# Patient Record
Sex: Female | Born: 1995 | Race: Black or African American | Hispanic: No | Marital: Single | Smoking: Never smoker
Health system: Southern US, Community
[De-identification: ages and names within clinical notes are randomized; demographics above are authoritative.]

## PROBLEM LIST (undated history)

## (undated) DIAGNOSIS — Z789 Other specified health status: Secondary | ICD-10-CM

## (undated) DIAGNOSIS — R0600 Dyspnea, unspecified: Secondary | ICD-10-CM

## (undated) HISTORY — PX: NO PAST SURGERIES: SHX2092

---
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
## 2017-05-15 ENCOUNTER — Other Ambulatory Visit: Payer: Self-pay

## 2017-05-15 ENCOUNTER — Emergency Department (HOSPITAL_COMMUNITY)
Admission: EM | Admit: 2017-05-15 | Discharge: 2017-05-15 | Disposition: A | Discharge status: Home / Self Care | Payer: Medicaid Other | Attending: Emergency Medicine | Admitting: Emergency Medicine

## 2017-05-15 ENCOUNTER — Encounter (HOSPITAL_COMMUNITY): Payer: Self-pay

## 2017-05-15 DIAGNOSIS — Z202 Contact with and (suspected) exposure to infections with a predominantly sexual mode of transmission: Secondary | ICD-10-CM | POA: Insufficient documentation

## 2017-05-15 DIAGNOSIS — Z79899 Other long term (current) drug therapy: Secondary | ICD-10-CM | POA: Insufficient documentation

## 2017-05-15 LAB — WET PREP, GENITAL
Sperm: NONE SEEN
Trich, Wet Prep: NONE SEEN
Yeast Wet Prep HPF POC: NONE SEEN

## 2017-05-15 LAB — POC URINE PREG, ED: PREG TEST UR: NEGATIVE

## 2017-05-15 LAB — URINALYSIS, ROUTINE W REFLEX MICROSCOPIC
Bilirubin Urine: NEGATIVE
Glucose, UA: NEGATIVE mg/dL
Hgb urine dipstick: NEGATIVE
Ketones, ur: NEGATIVE mg/dL
LEUKOCYTES UA: NEGATIVE
NITRITE: NEGATIVE
Protein, ur: NEGATIVE mg/dL
SPECIFIC GRAVITY, URINE: 1.026 (ref 1.005–1.030)
pH: 5 (ref 5.0–8.0)

## 2017-05-15 MED ORDER — AZITHROMYCIN 250 MG PO TABS
1000.0000 mg | ORAL_TABLET | Freq: Once | ORAL | Status: AC
  Administered 2017-05-15: 1000 mg via ORAL
  Filled 2017-05-15: qty 4

## 2017-05-15 MED ORDER — CEFTRIAXONE SODIUM 250 MG IJ SOLR
250.0000 mg | Freq: Once | INTRAMUSCULAR | Status: AC
  Administered 2017-05-15: 250 mg via INTRAMUSCULAR
  Filled 2017-05-15: qty 250

## 2017-05-15 MED ORDER — LIDOCAINE HCL (PF) 1 % IJ SOLN
INTRAMUSCULAR | Status: AC
  Administered 2017-05-15: 0.9 mL
  Filled 2017-05-15: qty 5

## 2017-05-15 NOTE — ED Triage Notes (Signed)
Patient reports that she thinks she may have been exposed to STD. No complains, denies symptoms.

## 2017-05-15 NOTE — Discharge Instructions (Signed)
Please read attached information. If you experience any new or worsening signs or symptoms please return to the emergency room for evaluation. Please follow-up with your primary care provider or specialist as discussed.  °

## 2017-05-15 NOTE — ED Notes (Signed)
Pt verbalized understanding of d.c instructions, no further questions 

## 2017-05-15 NOTE — ED Provider Notes (Signed)
[facility] Note  CSN: [id number] Arrival date & time: 05/15/17  1251  History   Chief Complaint No chief complaint on file.  HPI Rebekah Lloyd is a 21 y.o. female.  HPI   21 year old female presents today with reported STD exposure. Patient reports that her significant other was cheating on her with another female. She notes that she overheard that this other female had went to the health department for an unknown cause. She is concerned that potentially she has an STD and that's when she went to the clinic. Patient reports she would like to be tested and treated for STDs here today. She denies any significant symptoms including vaginal discharge, bleeding, abdominal pain, dysuria, or any other significant signs or symptoms.   Past Medical History:  Diagnosis Date  . Medical history non-contributory     Patient Active Problem List   Diagnosis Date Noted  . NSVD (normal spontaneous vaginal delivery) 11/22/2016  . Normal labor 11/19/2016    Past Surgical History:  Procedure Laterality Date  . NO PAST SURGERIES      OB History    Gravida Para Term Preterm AB Living   1 1 1     1    SAB TAB Ectopic Multiple Live Births         0 1       Home Medications    Prior to Admission medications   Medication Sig Start Date End Date Taking? Authorizing Provider  docusate sodium (COLACE) 100 MG capsule Take 1 capsule (100 mg total) by mouth 2 (two) times daily. 11/22/16   Mumaw, Hiram ComberElizabeth Woodland, DO  ibuprofen (ADVIL,MOTRIN) 600 MG tablet Take 1 tablet (600 mg total) by mouth every 6 (six) hours. 11/22/16   Mumaw, Hiram ComberElizabeth Woodland, DO  Prenatal Vit-Fe Fumarate-FA (PRENATAL MULTIVITAMIN) TABS tablet Take 1 tablet by mouth daily at 12 noon.    [provider]    Family History No family history on file.  Social History Social History   Tobacco Use  . Smoking status: Never Smoker  . Smokeless tobacco: Never Used    Substance Use Topics  . Alcohol use: No  . Drug use: No     Allergies   Patient has no known allergies.   Review of Systems Review of Systems  All other systems reviewed and are negative.    Physical Exam Updated Vital Signs BP 125/83 (BP Location: Right Arm)   Pulse 77   Temp 98.9 F (37.2 C) (Oral)   Resp 18   SpO2 100%   Physical Exam  Constitutional: She is oriented to person, place, and time. She appears well-developed and well-nourished.  HENT:  Head: Normocephalic and atraumatic.  Eyes: Conjunctivae are normal. Pupils are equal, round, and reactive to light. Right eye exhibits no discharge. Left eye exhibits no discharge. No scleral icterus.  Neck: Normal range of motion. No JVD present. No tracheal deviation present.  Pulmonary/Chest: Effort normal. No stridor.  Neurological: She is alert and oriented to person, place, and time. Coordination normal.  Psychiatric: She has a normal mood and affect. Her behavior is normal. Judgment and thought content normal.  Nursing note and vitals reviewed.    ED Treatments / Results  Labs (all labs ordered are listed, but only abnormal results are displayed) Labs Reviewed  WET PREP, GENITAL - Abnormal; Notable for the following components:      Result Value   Clue Cells Wet Prep HPF POC PRESENT (*)  WBC, Wet Prep HPF POC MANY (*)    All other components within normal limits  URINALYSIS, ROUTINE W REFLEX MICROSCOPIC - Abnormal; Notable for the following components:   APPearance HAZY (*)    All other components within normal limits  HIV ANTIBODY (ROUTINE TESTING)  POC URINE PREG, ED  GC/CHLAMYDIA PROBE AMP ([place]) NOT AT [facility]    EKG  EKG Interpretation None       Radiology No results found.  Procedures Procedures (including critical care time)  Medications Ordered in ED Medications  cefTRIAXone (ROCEPHIN) injection 250 mg (250 mg Intramuscular Given 05/15/17 1641)  azithromycin (ZITHROMAX) tablet  1,000 mg (1,000 mg Oral Given 05/15/17 1640)  lidocaine (PF) (XYLOCAINE) 1 % injection (0.9 mLs  Given 05/15/17 1641)     Initial Impression / Assessment and Plan / ED Course  I have reviewed the triage vital signs and the nursing notes.  Pertinent labs & imaging results that were available during my care of the patient were reviewed by me and considered in my medical decision making (see chart for details).     Final Clinical Impressions(s) / ED Diagnoses   Final diagnoses:  Potential exposure to STD    Labs: HIV, RPR, wet prep, gonorrhea, chlamydia  Imaging:  Consults:  Therapeutics: Ceftriaxone, azithromycin  Discharge Meds:   Assessment/Plan: 21 year old female presents today with complaints of potential STD exposure. She was tested here, she is requesting treatment. Patient has a reassuring physical exam with no significant discharge. She does have clue cells in her wet prep, I do not feel treatment would be appropriate in this asymptomatic individual. Patient is encouraged to follow her test results online, and inform any sexual partners of any positive results. Return as needed. She verbalized understanding and agreement to today's plan.      ED Discharge Orders    None       Rosalio LoudHedges, Alexza Norbeck, PA-C 05/15/17 2120    Bethann BerkshireZammit, Joseph, MD 05/16/17 [phone]

## 2017-05-16 LAB — GC/CHLAMYDIA PROBE AMP (~~LOC~~) NOT AT ARMC
Chlamydia: POSITIVE — AB
Neisseria Gonorrhea: NEGATIVE

## 2017-05-16 LAB — HIV ANTIBODY (ROUTINE TESTING W REFLEX): HIV Screen 4th Generation wRfx: NONREACTIVE

## 2017-12-16 DIAGNOSIS — Z5181 Encounter for therapeutic drug level monitoring: Secondary | ICD-10-CM | POA: Diagnosis not present

## 2017-12-18 DIAGNOSIS — Z5181 Encounter for therapeutic drug level monitoring: Secondary | ICD-10-CM | POA: Diagnosis not present

## 2017-12-24 DIAGNOSIS — Z5181 Encounter for therapeutic drug level monitoring: Secondary | ICD-10-CM | POA: Diagnosis not present

## 2017-12-26 DIAGNOSIS — Z5181 Encounter for therapeutic drug level monitoring: Secondary | ICD-10-CM | POA: Diagnosis not present

## 2018-12-29 ENCOUNTER — Telehealth: Payer: Self-pay | Admitting: Family Medicine

## 2018-12-29 NOTE — Telephone Encounter (Signed)
Patient came into the office to schedule an appointment for a pregnancy test. Patient was scheduled for 7/14 @ 1:30. Patient instructed to wear a face mask for the entire appointment and no visitors are allowed during the visit. Patient screened for covid symptoms and denied having any.

## 2018-12-30 ENCOUNTER — Ambulatory Visit (INDEPENDENT_AMBULATORY_CARE_PROVIDER_SITE_OTHER): Payer: Medicaid Other | Admitting: General Practice

## 2018-12-30 ENCOUNTER — Other Ambulatory Visit: Payer: Self-pay

## 2018-12-30 DIAGNOSIS — Z3201 Encounter for pregnancy test, result positive: Secondary | ICD-10-CM

## 2018-12-30 LAB — POCT PREGNANCY, URINE: Preg Test, Ur: POSITIVE — AB

## 2018-12-30 MED ORDER — PRENATAL PLUS 27-1 MG PO TABS: 1.0000 | ORAL_TABLET | Freq: Every day | ORAL | 11 refills | Status: DC

## 2018-12-30 NOTE — Progress Notes (Signed)
I agree with the nurses note and documentation.   Lezlie Lye, NP 12/30/2018 4:06 PM

## 2018-12-30 NOTE — Progress Notes (Signed)
Patient presents to office today for UPT. UPT +. Patient reports first positive home test on Saturday. LMP 11/22/18 EDD 08/29/18 [redacted]w[redacted]d. Patient denies taking any meds or vitamins. Rx for PNV sent in. Advised patient to begin OB care.   Koren Bound RN BSN 12/30/18

## 2019-01-27 ENCOUNTER — Telehealth: Payer: Self-pay | Admitting: Advanced Practice Midwife

## 2019-01-27 ENCOUNTER — Telehealth (INDEPENDENT_AMBULATORY_CARE_PROVIDER_SITE_OTHER): Payer: Self-pay | Admitting: *Deleted

## 2019-01-27 ENCOUNTER — Telehealth: Payer: Medicaid Other

## 2019-01-27 ENCOUNTER — Other Ambulatory Visit: Payer: Self-pay

## 2019-01-27 DIAGNOSIS — Z3490 Encounter for supervision of normal pregnancy, unspecified, unspecified trimester: Secondary | ICD-10-CM

## 2019-01-27 NOTE — Progress Notes (Signed)
2:10 I called [facility] for her New ob intake and heard a message " we're sorry, your call cannot be completed at this time." Linda,RN I connected with  Blanca Friend on 01/27/19 at  2:15 PM EDT by telephone and verified that I am speaking with the correct person using two identifiers.   I discussed the limitations, risks, security and privacy concerns of performing an evaluation and management service by telephone and virtually and the availability of in person appointments. I also discussed with the patient that there may be a patient responsible charge related to this service. The patient expressed understanding and agreed to proceed.   We confirmed her LMP actually started on 11/17/18 and ended on 11/22/18 so EDD is now 08/24/18.   Explained we will send her Babyscripts app- app sent to her while on phone.  Explained we would like her to take her blood pressure weekly and asked if she has a cuff. She states she thinks she has a cuff and will let us know at her appointment for new ob. She has Family planning medicaid  I explained once she a blood pressure cuff ; we would like her to enter into Babyscripts app.  Explained she will have some visits in office and some virtually. Reviewed appointment date/ time with her , our location and to wear mask, no visitors. Explained she will have exam, ob bloodwork, hemoglobin a1C, cbg , genetic testing if desired, pap if needed. Explained we will schedule an [place] at 19 weeks and she will get notification via [place]. She voices understanding.  Linda,RN 01/27/2019  2:33 PM

## 2019-01-27 NOTE — Telephone Encounter (Signed)
Called the patient to confirm the appointment. Received a message, we're sorry you call can not be completed at this time. Please hang up and try your call again later. °

## 2019-01-28 ENCOUNTER — Ambulatory Visit (INDEPENDENT_AMBULATORY_CARE_PROVIDER_SITE_OTHER): Payer: Self-pay | Admitting: Emergency Medicine

## 2019-01-28 ENCOUNTER — Other Ambulatory Visit (HOSPITAL_COMMUNITY)
Admission: RE | Admit: 2019-01-28 | Discharge: 2019-01-28 | Disposition: A | Discharge status: Home / Self Care | Payer: Medicaid Other | Source: Ambulatory Visit | Attending: Obstetrics & Gynecology | Admitting: Obstetrics & Gynecology

## 2019-01-28 DIAGNOSIS — N898 Other specified noninflammatory disorders of vagina: Secondary | ICD-10-CM | POA: Diagnosis not present

## 2019-01-28 NOTE — Progress Notes (Signed)
Pt here today for a self swab. Pt reports having a green discharge. Pt denies itching, burning, or having an odor. Pt was instructed on how to perform a self swab and informed that results take approx. 48 hours and she would be notified of abnormal results. Pt is on mychart and stated she would check there as well. Pt had no further questions or concerns.   Loma Sousa, RN 01/28/19 10:36

## 2019-01-28 NOTE — Progress Notes (Signed)
I have reviewed this chart and agree with the RN/CMA assessment and management.    K. Meryl Martise Waddell, M.D. Attending [facility] (Faculty Practice)   

## 2019-01-30 LAB — CERVICOVAGINAL ANCILLARY ONLY
Bacterial vaginitis: NEGATIVE
Candida vaginitis: NEGATIVE
Chlamydia: NEGATIVE
Neisseria Gonorrhea: NEGATIVE
Trichomonas: POSITIVE — AB

## 2019-01-31 NOTE — Progress Notes (Signed)
Chart reviewed for nurse visit. Agree with plan of care.   [place], [place] 01/31/2019 12:27 PM

## 2019-02-02 ENCOUNTER — Other Ambulatory Visit: Payer: Self-pay | Admitting: Obstetrics and Gynecology

## 2019-02-02 DIAGNOSIS — A599 Trichomoniasis, unspecified: Secondary | ICD-10-CM

## 2019-02-02 MED ORDER — METRONIDAZOLE 500 MG PO TABS: ORAL_TABLET | ORAL | 0 refills | Status: DC

## 2019-02-02 NOTE — Progress Notes (Signed)
Flagyl 2 g sent for trichomonas.

## 2019-02-03 ENCOUNTER — Telehealth: Payer: Self-pay

## 2019-02-03 NOTE — Telephone Encounter (Addendum)
-----   Message from Sloan Leiter, MD sent at 02/02/2019 11:58 PM EDT ----- Please call patient and inform she is positive for trichomonas, needs treatment. I have sent flagyl to the pharmacy on file.  Called pt and informed pt that tx has been sent to her [place] on [address].  And to please make sure that she tells her partner(s) as well.  I explained to the pt to abstain from intercourse at least two weeks from both being treated.  Pt verbalized understanding.

## 2019-02-10 ENCOUNTER — Other Ambulatory Visit: Payer: Self-pay

## 2019-02-10 ENCOUNTER — Other Ambulatory Visit (HOSPITAL_COMMUNITY)
Admission: RE | Admit: 2019-02-10 | Discharge: 2019-02-10 | Disposition: A | Discharge status: Home / Self Care | Payer: Medicaid Other | Source: Ambulatory Visit | Attending: Student | Admitting: Student

## 2019-02-10 ENCOUNTER — Ambulatory Visit (INDEPENDENT_AMBULATORY_CARE_PROVIDER_SITE_OTHER): Payer: Medicaid Other | Admitting: Student

## 2019-02-10 ENCOUNTER — Encounter: Payer: Self-pay | Admitting: Student

## 2019-02-10 DIAGNOSIS — Z3A12 12 weeks gestation of pregnancy: Secondary | ICD-10-CM | POA: Diagnosis not present

## 2019-02-10 DIAGNOSIS — Z3491 Encounter for supervision of normal pregnancy, unspecified, first trimester: Secondary | ICD-10-CM | POA: Diagnosis not present

## 2019-02-10 DIAGNOSIS — Z3490 Encounter for supervision of normal pregnancy, unspecified, unspecified trimester: Secondary | ICD-10-CM

## 2019-02-10 DIAGNOSIS — Z3481 Encounter for supervision of other normal pregnancy, first trimester: Secondary | ICD-10-CM | POA: Diagnosis not present

## 2019-02-10 MED ORDER — BLOOD PRESSURE MONITORING DEVI: 1.0000 | 0 refills | Status: DC

## 2019-02-10 NOTE — Progress Notes (Signed)
Medicaid Home Form Completed-02/10/2019 

## 2019-02-10 NOTE — Patient Instructions (Signed)
How to Take Your Blood Pressure You can take your blood pressure at home with a machine. You may need to check your blood pressure at home:  To check if you have high blood pressure (hypertension).  To check your blood pressure over time.  To make sure your blood pressure medicine is working. Supplies needed: You will need a blood pressure machine, or monitor. You can buy one at a drugstore or online. When choosing one:  Choose one with an arm cuff.  Choose one that wraps around your upper arm. Only one finger should fit between your arm and the cuff.  Do not choose one that measures your blood pressure from your wrist or finger. Your doctor can suggest a monitor. How to prepare Avoid these things for 30 minutes before checking your blood pressure:  Drinking caffeine.  Drinking alcohol.  Eating.  Smoking.  Exercising. Five minutes before checking your blood pressure:  Pee.  Sit in a dining chair. Avoid sitting in a soft couch or armchair.  Be quiet. Do not talk. How to take your blood pressure Follow the instructions that came with your machine. If you have a digital blood pressure monitor, these may be the instructions: 1. Sit up straight. 2. Place your feet on the floor. Do not cross your ankles or legs. 3. Rest your left arm at the level of your heart. You may rest it on a table, desk, or chair. 4. Pull up your shirt sleeve. 5. Wrap the blood pressure cuff around the upper part of your left arm. The cuff should be 1 inch (2.5 cm) above your elbow. It is best to wrap the cuff around bare skin. 6. Fit the cuff snugly around your arm. You should be able to place only one finger between the cuff and your arm. 7. Put the cord inside the groove of your elbow. 8. Press the power button. 9. Sit quietly while the cuff fills with air and loses air. 10. Write down the numbers on the screen. 11. Wait 2-3 minutes and then repeat steps 1-10. What do the numbers mean? Two  numbers make up your blood pressure. The first number is called systolic pressure. The second is called diastolic pressure. An example of a blood pressure reading is "120 over 80" (or 120/80). If you are an adult and do not have a medical condition, use this guide to find out if your blood pressure is normal: Normal  First number: below 120.  Second number: below 80. Elevated  First number: 120-129.  Second number: below 80. Hypertension stage 1  First number: 130-139.  Second number: 80-89. Hypertension stage 2  First number: 140 or above.  Second number: 90 or above. Your blood pressure is above normal even if only the top or bottom number is above normal. Follow these instructions at home:  Check your blood pressure as often as your doctor tells you to.  Take your monitor to your next doctor's appointment. Your doctor will: ? Make sure you are using it correctly. ? Make sure it is working right.  Make sure you understand what your blood pressure numbers should be.  Tell your doctor if your medicines are causing side effects. Contact a doctor if:  Your blood pressure keeps being high. Get help right away if:  Your first blood pressure number is higher than 180.  Your second blood pressure number is higher than 120. This information is not intended to replace advice given to you by your health   care provider. Make sure you discuss any questions you have with your health care provider. Document Released: 05/17/2008 Document Revised: 05/17/2017 Document Reviewed: 11/11/2015 Elsevier Patient Education  2020 Elsevier Inc.  

## 2019-02-10 NOTE — Progress Notes (Signed)
  Subjective:    Rebekah Lloyd is being seen today for her first obstetrical visit.  This is not a planned pregnancy. She is at [redacted]w[redacted]d gestation. Her obstetrical history is significant for none. Relationship with FOB: significant other, not living together but moving in together soon. . Patient does intend to breast feed. Pregnancy history fully reviewed.  Patient reports no complaints. Her nausea and vomiting have resulted. .  Review of Systems:   Review of Systems  Objective:     BP 125/82   Pulse (!) 105   Temp 98.6 F (37 C)   Wt 138 lb 8 oz (62.8 kg)   LMP 11/17/2018 (Exact Date)   BMI 21.69 kg/m  Physical Exam  Constitutional: She is oriented to person, place, and time. She appears well-developed.  HENT:  Head: Normocephalic.  Neck: Normal range of motion.  Cardiovascular: Normal rate.  Respiratory: Effort normal.  GI: Soft. Bowel sounds are normal.  Genitourinary:    Vagina normal.     No vaginal discharge.     Genitourinary Comments: NEFG; S=D; no tenderness or masses on palpation.    Neurological: She is alert and oriented to person, place, and time.  Skin: Skin is warm and dry.    Maternal Exam:  Introitus: Vagina is negative for discharge.       Assessment:    Pregnancy: G2P1001 Patient Active Problem List   Diagnosis Date Noted  . Supervision of low-risk pregnancy 01/27/2019       Plan:     Initial labs drawn. Prenatal vitamins. Problem list reviewed and updated. AFP3 discussed: will order for anatomy scan. Role of ultrasound in pregnancy discussed; fetal survey: ordered. Amniocentesis discussed: not indicated. Follow up in 8 weeks. 75% of 30 min visit spent on counseling and coordination of care.   -Discussed birth control -Pap collected today.  -FHR was 164 by [place] with Diane Day -Knows to await call from [place] for BP cuff.  -Explained how to take BP; patient remembers how to take from her [place] patient to  practice -Patient needs to be enrolled in [place]; will send message to RN to enroll.  -Genetic testing drawn Mervyn Skeeters [facility] 02/11/2019

## 2019-02-11 ENCOUNTER — Telehealth: Payer: Self-pay | Admitting: *Deleted

## 2019-02-11 DIAGNOSIS — Z3490 Encounter for supervision of normal pregnancy, unspecified, unspecified trimester: Secondary | ICD-10-CM | POA: Diagnosis not present

## 2019-02-11 LAB — OBSTETRIC PANEL, INCLUDING HIV
Antibody Screen: NEGATIVE
Basophils Absolute: 0 10*3/uL (ref 0.0–0.2)
Basos: 1 %
EOS (ABSOLUTE): 0 10*3/uL (ref 0.0–0.4)
Eos: 1 %
HIV Screen 4th Generation wRfx: NONREACTIVE
Hematocrit: 38.6 % (ref 34.0–46.6)
Hemoglobin: 12.9 g/dL (ref 11.1–15.9)
Hepatitis B Surface Ag: NEGATIVE
Immature Grans (Abs): 0 10*3/uL (ref 0.0–0.1)
Immature Granulocytes: 0 %
Lymphocytes Absolute: 2.3 10*3/uL (ref 0.7–3.1)
Lymphs: 45 %
MCH: 30.3 pg (ref 26.6–33.0)
MCHC: 33.4 g/dL (ref 31.5–35.7)
MCV: 91 fL (ref 79–97)
Monocytes Absolute: 0.3 10*3/uL (ref 0.1–0.9)
Monocytes: 6 %
Neutrophils Absolute: 2.4 10*3/uL (ref 1.4–7.0)
Neutrophils: 47 %
Platelets: 278 10*3/uL (ref 150–450)
RBC: 4.26 x10E6/uL (ref 3.77–5.28)
RDW: 12.4 % (ref 11.7–15.4)
RPR Ser Ql: NONREACTIVE
Rh Factor: POSITIVE
Rubella Antibodies, IGG: 2.85 index (ref >0.99)
WBC: 5.1 10*3/uL (ref 3.4–10.8)

## 2019-02-11 LAB — HEMOGLOBIN A1C
Est. average glucose Bld gHb Est-mCnc: 94 mg/dL
Hgb A1c MFr Bld: 4.9 % (ref 4.8–5.6)

## 2019-02-11 NOTE — Telephone Encounter (Signed)
Per review of babyscripts patient has only logged in once with a weight since signed up. I called [place] and was unable to leave a message- heard a message call cannot be completed at this time.  Will send MyChart message.  Hoa Briggs,RN

## 2019-02-12 LAB — CYTOLOGY - PAP
Adequacy: ABSENT
Chlamydia: NEGATIVE
Diagnosis: NEGATIVE
Neisseria Gonorrhea: NEGATIVE

## 2019-02-12 LAB — URINE CULTURE, OB REFLEX

## 2019-02-12 LAB — CULTURE, OB URINE

## 2019-02-12 NOTE — Progress Notes (Signed)
Chart reviewed for nurse visit. Agree with plan of care.   Kooistra, Kathryn Lorraine, CNM 02/12/2019 12:39 PM   

## 2019-02-18 ENCOUNTER — Encounter: Payer: Self-pay | Admitting: *Deleted

## 2019-02-27 ENCOUNTER — Encounter: Payer: Self-pay | Admitting: *Deleted

## 2019-03-31 ENCOUNTER — Ambulatory Visit (INDEPENDENT_AMBULATORY_CARE_PROVIDER_SITE_OTHER): Payer: Medicaid Other | Admitting: Student

## 2019-03-31 ENCOUNTER — Other Ambulatory Visit: Payer: Self-pay

## 2019-03-31 ENCOUNTER — Ambulatory Visit (HOSPITAL_COMMUNITY)
Admission: RE | Admit: 2019-03-31 | Discharge: 2019-03-31 | Disposition: A | Discharge status: Home / Self Care | Payer: Medicaid Other | Source: Ambulatory Visit | Attending: Obstetrics and Gynecology | Admitting: Obstetrics and Gynecology

## 2019-03-31 DIAGNOSIS — Z3490 Encounter for supervision of normal pregnancy, unspecified, unspecified trimester: Secondary | ICD-10-CM | POA: Diagnosis not present

## 2019-03-31 DIAGNOSIS — Z363 Encounter for antenatal screening for malformations: Secondary | ICD-10-CM

## 2019-03-31 DIAGNOSIS — Z3A19 19 weeks gestation of pregnancy: Secondary | ICD-10-CM

## 2019-03-31 DIAGNOSIS — Z3492 Encounter for supervision of normal pregnancy, unspecified, second trimester: Secondary | ICD-10-CM

## 2019-03-31 NOTE — Patient Instructions (Signed)
Glucose Tolerance Test During Pregnancy Why am I having this test? The glucose tolerance test (GTT) is done to check how your body processes sugar (glucose). This is one of several tests used to diagnose diabetes that develops during pregnancy (gestational diabetes mellitus). Gestational diabetes is a temporary form of diabetes that some women develop during pregnancy. It usually occurs during the second trimester of pregnancy and goes away after delivery. Testing (screening) for gestational diabetes usually occurs between 24 and 28 weeks of pregnancy. You may have the GTT test after having a 1-hour glucose screening test if the results from that test indicate that you may have gestational diabetes. You may also have this test if:  You have a history of gestational diabetes.  You have a history of giving birth to very large babies or have experienced repeated fetal loss (stillbirth).  You have signs and symptoms of diabetes, such as: ? Changes in your vision. ? Tingling or numbness in your hands or feet. ? Changes in hunger, thirst, and urination that are not otherwise explained by your pregnancy. What is being tested? This test measures the amount of glucose in your blood at different times during a period of 3 hours. This indicates how well your body is able to process glucose. What kind of sample is taken?  Blood samples are required for this test. They are usually collected by inserting a needle into a blood vessel. How do I prepare for this test?  For 3 days before your test, eat normally. Have plenty of carbohydrate-rich foods.  Follow instructions from your health care provider about: ? Eating or drinking restrictions on the day of the test. You may be asked to not eat or drink anything other than water (fast) starting 8-10 hours before the test. ? Changing or stopping your regular medicines. Some medicines may interfere with this test. Tell a health care provider about:  All  medicines you are taking, including vitamins, herbs, eye drops, creams, and over-the-counter medicines.  Any blood disorders you have.  Any surgeries you have had.  Any medical conditions you have. What happens during the test? First, your blood glucose will be measured. This is referred to as your fasting blood glucose, since you fasted before the test. Then, you will drink a glucose solution that contains a certain amount of glucose. Your blood glucose will be measured again 1, 2, and 3 hours after drinking the solution. This test takes about 3 hours to complete. You will need to stay at the testing location during this time. During the testing period:  Do not eat or drink anything other than the glucose solution.  Do not exercise.  Do not use any products that contain nicotine or tobacco, such as cigarettes and e-cigarettes. If you need help stopping, ask your health care provider. The testing procedure may vary among health care providers and hospitals. How are the results reported? Your results will be reported as milligrams of glucose per deciliter of blood (mg/dL) or millimoles per liter (mmol/L). Your health care provider will compare your results to normal ranges that were established after testing a large group of people (reference ranges). Reference ranges may vary among labs and hospitals. For this test, common reference ranges are:  Fasting: less than 95-105 mg/dL (5.3-5.8 mmol/L).  1 hour after drinking glucose: less than 180-190 mg/dL (10.0-10.5 mmol/L).  2 hours after drinking glucose: less than 155-165 mg/dL (8.6-9.2 mmol/L).  3 hours after drinking glucose: 140-145 mg/dL (7.8-8.1 mmol/L). What do the   results mean? Results within reference ranges are considered normal, meaning that your glucose levels are well-controlled. If two or more of your blood glucose levels are high, you may be diagnosed with gestational diabetes. If only one level is high, your health care  provider may suggest repeat testing or other tests to confirm a diagnosis. Talk with your health care provider about what your results mean. Questions to ask your health care provider Ask your health care provider, or the department that is doing the test:  When will my results be ready?  How will I get my results?  What are my treatment options?  What other tests do I need?  What are my next steps? Summary  The glucose tolerance test (GTT) is one of several tests used to diagnose diabetes that develops during pregnancy (gestational diabetes mellitus). Gestational diabetes is a temporary form of diabetes that some women develop during pregnancy.  You may have the GTT test after having a 1-hour glucose screening test if the results from that test indicate that you may have gestational diabetes. You may also have this test if you have any symptoms or risk factors for gestational diabetes.  Talk with your health care provider about what your results mean. This information is not intended to replace advice given to you by your health care provider. Make sure you discuss any questions you have with your health care provider. Document Released: 12/04/2011 Document Revised: 09/25/2018 Document Reviewed: 01/14/2017 Elsevier Patient Education  2020 Elsevier Inc.  

## 2019-03-31 NOTE — Progress Notes (Signed)
   PRENATAL VISIT NOTE  Subjective:  Rebekah Lloyd is a 23 y.o. G2P1001 at [redacted]w[redacted]d being seen today for ongoing prenatal care.  She is currently monitored for the following issues for this low-risk pregnancy and has Supervision of low-risk pregnancy on their problem list.  Patient reports no complaints.  Contractions: Not present. Vag. Bleeding: None.  Movement: Present. Denies leaking of fluid.   The following portions of the patient's history were reviewed and updated as appropriate: allergies, current medications, past family history, past medical history, past social history, past surgical history and problem list.   Objective:   Vitals:   03/31/19 0820  BP: 120/77  Pulse: 96  Weight: 140 lb 6.4 oz (63.7 kg)    Fetal Status: Fetal Heart Rate (bpm): 152   Movement: Present     General:  Alert, oriented and cooperative. Patient is in no acute distress.  Skin: Skin is warm and dry. No rash noted.   Cardiovascular: Normal heart rate noted  Respiratory: Normal respiratory effort, no problems with respiration noted  Abdomen: Soft, gravid, appropriate for gestational age.  Pain/Pressure: Absent     Pelvic: Cervical exam deferred        Extremities: Normal range of motion.  Edema: None  Mental Status: Normal mood and affect. Normal behavior. Normal judgment and thought content.   Assessment and Plan:  Pregnancy: G2P1001 at [redacted]w[redacted]d 1. Encounter for supervision of low-risk pregnancy, antepartum -BP was 121/80 at home last week.  -She has BabyRx; reviewed warning signs and BP values that we would be concerned about; how to re-take.   - AFP, Serum, Open Spina Bifida  Preterm labor symptoms and general obstetric precautions including but not limited to vaginal bleeding, contractions, leaking of fluid and fetal movement were reviewed in detail with the patient. Please refer to After Visit Summary for other counseling recommendations.   Return in about 8 weeks (around 05/26/2019), or 2  hour GTT and LROB.  Future Appointments  Date Time Provider [place]  05/26/2019  8:20 AM [facility] LAB [facility]    Mervyn Skeeters [place], [place]

## 2019-04-02 LAB — AFP, SERUM, OPEN SPINA BIFIDA
AFP MoM: 1.19
AFP Value: 68.8 ng/mL
Gest. Age on Collection Date: 19.1 weeks
Maternal Age At EDD: 22 yr
OSBR Risk 1 IN: 10000
Test Results:: NEGATIVE
Weight: 140 [lb_av]

## 2019-04-22 ENCOUNTER — Telehealth: Payer: Self-pay | Admitting: General Practice

## 2019-04-22 ENCOUNTER — Ambulatory Visit (INDEPENDENT_AMBULATORY_CARE_PROVIDER_SITE_OTHER): Payer: Medicaid Other | Admitting: Advanced Practice Midwife

## 2019-04-22 ENCOUNTER — Other Ambulatory Visit: Payer: Self-pay

## 2019-04-22 ENCOUNTER — Other Ambulatory Visit (HOSPITAL_COMMUNITY)
Admission: RE | Admit: 2019-04-22 | Discharge: 2019-04-22 | Disposition: A | Discharge status: Home / Self Care | Payer: Medicaid Other | Source: Ambulatory Visit | Attending: Advanced Practice Midwife | Admitting: Advanced Practice Midwife

## 2019-04-22 VITALS — BP 120/59 | HR 78 | Temp 98.6°F | Wt 145.6 lb

## 2019-04-22 DIAGNOSIS — Z202 Contact with and (suspected) exposure to infections with a predominantly sexual mode of transmission: Secondary | ICD-10-CM | POA: Diagnosis not present

## 2019-04-22 DIAGNOSIS — Z8619 Personal history of other infectious and parasitic diseases: Secondary | ICD-10-CM | POA: Diagnosis not present

## 2019-04-22 DIAGNOSIS — A599 Trichomoniasis, unspecified: Secondary | ICD-10-CM | POA: Diagnosis not present

## 2019-04-22 NOTE — Telephone Encounter (Signed)
Called patient regarding mychart message & work in appt for this morning at 11:15am, no answer- left message to call us back asap for appt scheduled this morning. Will send mychart message back

## 2019-04-22 NOTE — Progress Notes (Signed)
Pt states after having sex yesterday that she noticed bumps/blisters with blood after she wiped.

## 2019-04-22 NOTE — Progress Notes (Signed)
PROBLEM VISIT NOTE  History:     Rebekah Lloyd is a 23 y.o. G2P1001 at G90P1001  female here for evaluation of new onset right labial lesion. She endorses intercourse with her boyfriend of one year on Sunday and Monday. She noticed a "bump" that was tender to palpation about one day later. She experienced new onset contact bleeding when she wiped after voiding this morning. Denies headache, dysuria, malaise, hx of HSV.      Denies vaginal bleeding, leaking of fluid, decreased fetal movement, fever, falls, or recent illness.   Patient endorses history of Chlamydia in 2018 and Trichomonas earlier this pregnancy. She states she had another partner in August from whom she contracted Trich but has not been with that person since she was treated. She and her FOB "Darrick Meigs" do not use condoms. She states he was evaluated and treated close to the time she was treated.   Obstetric History OB History  Gravida Para Term Preterm AB Living  2 1 1     1   SAB TAB Ectopic Multiple Live Births        0 1    # Outcome Date GA Lbr Len/2nd Weight Sex Delivery Anes PTL Lv  2 Current           1 Term 11/20/16 [redacted]w[redacted]d 09:11 / 00:32 6 lb 15.5 oz (3.16 kg) F Vag-Spont EPI  LIV     Birth Comments: no complications    Past Medical History:  Diagnosis Date  . Medical history non-contributory     Past Surgical History:  Procedure Laterality Date  . NO PAST SURGERIES      Current Outpatient Medications on File Prior to Visit  Medication Sig Dispense Refill  . Blood Pressure Monitoring DEVI 1 Device by Does not apply route once a week. OCD 10 ; Z34.90 1 Device 0  . Prenatal Vit-Fe Fumarate-FA (PRENATAL MULTIVITAMIN) TABS tablet Take 1 tablet by mouth daily at 12 noon.    . prenatal vitamin w/FE, FA (PRENATAL 1 + 1) 27-1 MG TABS tablet Take 1 tablet by mouth daily at 12 noon. 30 tablet 11  . docusate sodium (COLACE) 100 MG capsule Take 1 capsule (100 mg total) by mouth 2 (two) times daily. (Patient not  taking: Reported on 02/10/2019) 30 capsule 0  . ibuprofen (ADVIL,MOTRIN) 600 MG tablet Take 1 tablet (600 mg total) by mouth every 6 (six) hours. (Patient not taking: Reported on 02/10/2019) 30 tablet 0   No current facility-administered medications on file prior to visit.     No Known Allergies  Social History:  reports that she has never smoked. She has never used smokeless tobacco. She reports that she does not drink alcohol or use drugs.  Family History  Problem Relation Age of Onset  . Colon cancer Father     The following portions of the patient's history were reviewed and updated as appropriate: allergies, current medications, past family history, past medical history, past social history, past surgical history and problem list.  Review of Systems Pertinent items noted in HPI and remainder of comprehensive ROS otherwise negative.  Physical Exam:  BP (!) 120/59   Pulse 78   Temp 98.6 F (37 C)   Wt 145 lb 9.6 oz (66 kg)   LMP 11/17/2018 (Exact Date)   BMI 22.80 kg/m  CONSTITUTIONAL: Well-developed, well-nourished female in no acute distress.  HENT:  Normocephalic, atraumatic EYES: Conjunctivae and EOM are normal. Pupils are equal, round, and reactive to  light. No scleral icterus.  NECK: Normal range of motion, supple, no masses.   SKIN: Skin is warm and dry. No rash noted. Not diaphoretic. No erythema. No pallor. MUSCULOSKELETAL: Normal range of motion. No tenderness.  No cyanosis, clubbing, or edema.  2+ distal pulses. NEUROLOGIC: Alert and oriented to person, place, and time. Normal reflexes, muscle tone coordination. No cranial nerve deficit noted. PSYCHIATRIC: Normal mood and affect. Normal behavior. Normal judgment and thought content. CARDIOVASCULAR: Normal heart rate noted, regular rhythm RESPIRATORY: Effort normal, no problems with respiration noted. PELVIC: Normal appearing external genitalia; two small open lesions noted externally on right labia at border of  pubic hair. Suspicious for ingrown hairs. Pubic hair shaved, noted to have significant areas of regrowth and multiple ingrown hairs across perineum.  Vaginal swab collected via blind swab   Assessment and Plan:    1. Possible exposure to STD  - Cervicovaginal ancillary only( [place]) - Herpes simplex virus culture - Strongly encouraged condom use for all sexual encounters due to the highly contagious nature of STIs.  2. Trichomonas infection - Treated mid-August 2020  3. History of chlamydia - problem list updated  Will follow up results of swabs and manage accordingly Routine preventative health maintenance measures emphasized. Please refer to After Visit Summary for other counseling recommendations.     Next OB appt is 05/26/2019  Clayton Bibles, MSN, CNM Certified Nurse Midwife, [facility] for [facility], [facility] 04/22/19 12:25 PM

## 2019-04-22 NOTE — Patient Instructions (Signed)
Pregnancy and Sexually Transmitted Infections An STI (sexually transmitted infection) is a disease or infection that may be passed (transmitted) from person to person, usually during sexual activity. This may happen by way of saliva, semen, blood, vaginal mucus, or urine. An STI can be caused by bacteria, viruses, or parasites. During pregnancy, STIs can be dangerous for you and your unborn baby. It is important to take steps to reduce your chances of getting an STI. Also, you need to be seen by your health care provider right away if you think you may have an STI, or if you think you may have been exposed to an STI. Diagnosis and treatment will depend on the type of STI. If you are already pregnant, you will be screened for HIV (human immunodeficiency virus) early in your pregnancy. If you are at high risk for HIV, this test may be repeated during your third trimester of pregnancy. What are some common STIs? There are different types of STIs. Some STIs that cause problems in pregnancy include:  Gonorrhea.  Chlamydia.  Syphilis.  HIV and AIDS (acquired immunodeficiency syndrome).  Genital herpes.  Hepatitis.  Genital warts.  Human papillomavirus (HPV).  Trichomoniasis. STIs that do not affect the baby include:  Chancroid.  Pubic lice. What are the possible effects of STIs during pregnancy? STIs can cause:  Stillbirth.  Miscarriage.  Premature labor.  Premature rupture of the membranes.  Serious birth defects or deformities.  Infection of the amniotic sac.  Infections that occur after birth (postpartum) in you and the baby.  Slowed growth of the baby before birth.  Illnesses in newborns. What are common symptoms of STIs? Different STIs have different symptoms. Some women may not have any symptoms. If symptoms are present, they may include:  Painful or bloody urination.  Pain in the pelvis, abdomen, vagina, anus, throat, or eyes.  A skin rash, itching, or  irritation.  Growths, ulcerations, blisters, or sores in the genital and anal areas.  Fever.  Abnormal vaginal discharge, with or without bad odor.  Pain or bleeding during sexual intercourse.  Yellowing of the skin and the white parts of the eyes (jaundice).  Swollen glands in the groin area. Even if symptoms are not present, an STI can still be passed to another person during sexual contact. How are STIs diagnosed? Your health care provider can use tests to determine if you have an STI. These may include blood tests, urine tests, and tests performed during a pelvic exam. You should be screened for STIs, including gonorrhea and chlamydia, if:  You are sexually active and are younger than age 2.  You are age 21 or older and your health care provider tells you that you are at risk for these types of infections.  Your sexual activity has changed since the last time you were screened, and you are at an increased risk for chlamydia or gonorrhea. Ask your health care provider if you are at risk. How can I reduce my risk of getting an STI? Take these actions to reduce your risk of getting an STI:  Do not have any oral, vaginal, or anal sex. This is known as practicing abstinence.  If you have sex, use a latex condom or a female condom consistently and correctly during sexual intercourse.  Use dental dams and water-soluble lubricants during sexual activity. Do not use petroleum jelly or oils.  Avoid having multiple sexual partners.  Do not have sex with someone who has other sexual partners.  Do not  have sex with anyone you do not know or who is at high risk for an STI.  Avoid risky sex acts that can break the skin.  Do not have sex if you have open sores on your mouth or skin.  Avoid engaging in oral and anal sex acts.  Get the hepatitis vaccine. It is safe for pregnant women.  What should I do if I think I have an STI?  See your health care provider.  Tell your sexual  partner or partners. They should be tested and treated for any STIs.  Do not have sex until your health care provider says it is okay. Get help right away if: Contact your health care provider right away if:  You have any symptoms of an STI.  You think that you have, or your sexual partner has, an STI even if there are no symptoms.  You think that you may have been exposed to an STI. This information is not intended to replace advice given to you by your health care provider. Make sure you discuss any questions you have with your health care provider. Document Released: 07/12/2004 Document Revised: 09/26/2018 Document Reviewed: 01/09/2016 Elsevier Patient Education  2020 [employer].

## 2019-04-23 ENCOUNTER — Telehealth: Payer: Self-pay | Admitting: Advanced Practice Midwife

## 2019-04-23 LAB — CERVICOVAGINAL ANCILLARY ONLY
Bacterial Vaginitis (gardnerella): POSITIVE — AB
Candida Glabrata: NEGATIVE
Candida Vaginitis: NEGATIVE
Chlamydia: POSITIVE — AB
Comment: NEGATIVE
Comment: NEGATIVE
Comment: NEGATIVE
Comment: NEGATIVE
Comment: NEGATIVE
Comment: NORMAL
Neisseria Gonorrhea: NEGATIVE
Trichomonas: NEGATIVE

## 2019-04-23 MED ORDER — AZITHROMYCIN 500 MG PO TABS
1000.0000 mg | ORAL_TABLET | Freq: Once | ORAL | 0 refills | Status: AC
Start: 2019-04-23 — End: 2019-04-23

## 2019-04-23 MED ORDER — METRONIDAZOLE 500 MG PO TABS: 500.0000 mg | ORAL_TABLET | Freq: Two times a day (BID) | ORAL | 0 refills | Status: DC

## 2019-04-23 NOTE — Telephone Encounter (Signed)
+   Chlamydia and BV in clinic. Preemptive teaching during clinic appointment. Patient notified of results via phone. Again emphasized importance of consistent condom use due to contagious nature of STIs. Advised partner treatment ASAP. Pt verbalized understanding.   Mallie Snooks, MSN, CNM Certified Nurse Midwife, [employer] for [employer], [place] [facility] 04/23/19 4:10 PM

## 2019-04-24 LAB — HERPES SIMPLEX VIRUS CULTURE

## 2019-04-27 ENCOUNTER — Encounter: Payer: Self-pay | Admitting: Advanced Practice Midwife

## 2019-04-27 ENCOUNTER — Other Ambulatory Visit: Payer: Self-pay | Admitting: Advanced Practice Midwife

## 2019-04-27 DIAGNOSIS — B009 Herpesviral infection, unspecified: Secondary | ICD-10-CM | POA: Insufficient documentation

## 2019-04-27 MED ORDER — VALACYCLOVIR HCL 1 G PO TABS: 1000.0000 mg | ORAL_TABLET | Freq: Two times a day (BID) | ORAL | 0 refills | Status: DC

## 2019-04-27 MED ORDER — VALACYCLOVIR HCL 1 G PO TABS: 1000.0000 mg | ORAL_TABLET | Freq: Two times a day (BID) | ORAL | 3 refills | Status: AC

## 2019-04-27 NOTE — Progress Notes (Signed)
HSV 2 outbreak complicating pregnancy. Initial outbreak per patient. Notified via active MyChart.  Mallie Snooks, MSN, CNM Certified Nurse Midwife, [facility] for [employer], [place] [facility] 04/27/19 7:55 AM

## 2019-05-02 DIAGNOSIS — Z1159 Encounter for screening for other viral diseases: Secondary | ICD-10-CM | POA: Diagnosis not present

## 2019-05-26 ENCOUNTER — Other Ambulatory Visit (HOSPITAL_COMMUNITY)
Admission: RE | Admit: 2019-05-26 | Discharge: 2019-05-26 | Disposition: A | Discharge status: Home / Self Care | Payer: Medicaid Other | Source: Ambulatory Visit | Attending: Student | Admitting: Student

## 2019-05-26 ENCOUNTER — Ambulatory Visit (INDEPENDENT_AMBULATORY_CARE_PROVIDER_SITE_OTHER): Payer: Medicaid Other | Admitting: Student

## 2019-05-26 ENCOUNTER — Other Ambulatory Visit: Payer: Medicaid Other

## 2019-05-26 ENCOUNTER — Other Ambulatory Visit: Payer: Self-pay

## 2019-05-26 VITALS — BP 111/66 | HR 89 | Wt 148.9 lb

## 2019-05-26 DIAGNOSIS — Z3493 Encounter for supervision of normal pregnancy, unspecified, third trimester: Secondary | ICD-10-CM | POA: Insufficient documentation

## 2019-05-26 DIAGNOSIS — Z3490 Encounter for supervision of normal pregnancy, unspecified, unspecified trimester: Secondary | ICD-10-CM | POA: Diagnosis not present

## 2019-05-26 DIAGNOSIS — Z23 Encounter for immunization: Secondary | ICD-10-CM

## 2019-05-26 DIAGNOSIS — Z3A27 27 weeks gestation of pregnancy: Secondary | ICD-10-CM | POA: Diagnosis not present

## 2019-05-26 DIAGNOSIS — Z3492 Encounter for supervision of normal pregnancy, unspecified, second trimester: Secondary | ICD-10-CM

## 2019-05-26 NOTE — Progress Notes (Signed)
   PRENATAL VISIT NOTE  Subjective:  Rebekah Lloyd is a 23 y.o. G2P1001 at [redacted]w[redacted]d being seen today for ongoing prenatal care.  She is currently monitored for the following issues for this low-risk pregnancy and has Supervision of low-risk pregnancy; History of chlamydia; Trichomonas infection; and HSV-2 infection complicating pregnancy on their problem list.  Patient reports no complaints. Patient lesions has healed.  Contractions: Not present. Vag. Bleeding: None.  Movement: Present. Denies leaking of fluid.   The following portions of the patient's history were reviewed and updated as appropriate: allergies, current medications, past family history, past medical history, past social history, past surgical history and problem list.   Objective:   Vitals:   05/26/19 0906  BP: 111/66  Pulse: 89  Weight: 148 lb 14.4 oz (67.5 kg)    Fetal Status: Fetal Heart Rate (bpm): 143   Movement: Present     General:  Alert, oriented and cooperative. Patient is in no acute distress.  Skin: Skin is warm and dry. No rash noted.   Cardiovascular: Normal heart rate noted  Respiratory: Normal respiratory effort, no problems with respiration noted  Abdomen: Soft, gravid, appropriate for gestational age.  Pain/Pressure: Absent     Pelvic: Cervical exam deferred        Extremities: Normal range of motion.  Edema: None  Mental Status: Normal mood and affect. Normal behavior. Normal judgment and thought content.   Assessment and Plan:  Pregnancy: G2P1001 at [redacted]w[redacted]d 1. Encounter for supervision of low-risk pregnancy in third trimester -TOC for chlamydia today - Tdap vaccine greater than or equal to 7yo IM -Reviewed blood pressure readings and when to come to MAU; patient says that she will have her BabyRx working and if not she will message clinic with her BP checks.   Preterm labor symptoms and general obstetric precautions including but not limited to vaginal bleeding, contractions, leaking of fluid  and fetal movement were reviewed in detail with the patient. Please refer to After Visit Summary for other counseling recommendations.   Return in about 4 weeks (around 06/23/2019), or My chart LROB.  Future Appointments  Date Time Provider [place]  06/24/2019  4:15 PM Luvenia Redden, PA-C [facility] [place], [place]

## 2019-05-26 NOTE — Patient Instructions (Signed)

## 2019-05-27 LAB — CBC
Hematocrit: 30.7 % — ABNORMAL LOW (ref 34.0–46.6)
Hemoglobin: 10 g/dL — ABNORMAL LOW (ref 11.1–15.9)
MCH: 30.3 pg (ref 26.6–33.0)
MCHC: 32.6 g/dL (ref 31.5–35.7)
MCV: 93 fL (ref 79–97)
Platelets: 298 10*3/uL (ref 150–450)
RBC: 3.3 x10E6/uL — ABNORMAL LOW (ref 3.77–5.28)
RDW: 12 % (ref 11.7–15.4)
WBC: 5.4 10*3/uL (ref 3.4–10.8)

## 2019-05-27 LAB — CERVICOVAGINAL ANCILLARY ONLY
Bacterial Vaginitis (gardnerella): NEGATIVE
Chlamydia: NEGATIVE
Comment: NEGATIVE
Comment: NEGATIVE
Comment: NORMAL
Neisseria Gonorrhea: NEGATIVE

## 2019-05-27 LAB — HIV ANTIBODY (ROUTINE TESTING W REFLEX): HIV Screen 4th Generation wRfx: NONREACTIVE

## 2019-05-27 LAB — GLUCOSE TOLERANCE, 2 HOURS W/ 1HR
Glucose, 1 hour: 98 mg/dL (ref 65–179)
Glucose, 2 hour: 77 mg/dL (ref 65–152)
Glucose, Fasting: 75 mg/dL (ref 65–91)

## 2019-05-27 LAB — RPR: RPR Ser Ql: NONREACTIVE

## 2019-06-04 ENCOUNTER — Encounter: Payer: Self-pay | Admitting: *Deleted

## 2019-06-17 ENCOUNTER — Encounter

## 2019-06-19 NOTE — L&D Delivery Note (Addendum)
OB/GYN Faculty Practice Delivery Note  Rebekah Lloyd is a 24 y.o. G2P1001 s/p SVD at [redacted]w[redacted]d. She was admitted for IOL due to post dates and borderline oligohydramnios (AFI 5.6).   AROM: 0h 76m with clear fluid GBS Status:  Negative/-- (02/09 1046) Maximum Maternal Temperature: 98.9 F  Labor Progress: . Patient presented to L&D for IOL due to post dates and borderline oligohydramnios. Initial SVE: 3cm. Labor course was uncomplicated. She then progressed to complete.   Delivery Date/Time: 08/26/2019 @ 2301 Delivery: Called to room and patient was complete and pushing. Head position was ROP and delivered with ease over the perineum. No nuchal cord present. Shoulder and body delivered in usual fashion. Infant with spontaneous cry, placed on mother's abdomen, dried and stimulated. Cord clamped x 2 after 1-minute delay, and cut by FoB. Cord blood drawn. During placental delivery, there was cord avulsion. Placenta was delivered via manual extraction and appeared intact, she was given 2g Ancef prophylactic antibiotics. Fundus firm with massage and pitocin started. Labia, perineum, vagina, and cervix inspected and significant for bilateral periurethral lacerations and a first degree perineal laceration.   Baby Weight: 3419g   Cord: central insertion, 3 vessel Placenta: Appears intact, sent to L&D Complications: Cord avulsion and manual extraction of the placenta Lacerations: Bilateral periurethral lacerations and a first degree perineal laceration. First degree and left periurethral lacerations repaired in the standard fashion EBL: 600cc Analgesia: Epidural   Infant: APGAR (1 MIN): 8   APGAR (5 MINS): 9    Aldean Jewett MD, PGY-1 OBGYN Faculty Teaching Service  08/26/2019, 11:47 PM    OB FELLOW ATTESTATION  I was present, gloved, and supervising throughout delivery and have edited the above note to reflect any changes or updates.  Zack Seal, MD/[facility] OB Fellow  08/27/2019, 2:49 AM

## 2019-06-24 ENCOUNTER — Telehealth (INDEPENDENT_AMBULATORY_CARE_PROVIDER_SITE_OTHER): Payer: Medicaid Other | Admitting: Medical

## 2019-06-24 ENCOUNTER — Encounter: Payer: Self-pay | Admitting: Medical

## 2019-06-24 VITALS — BP 121/80 | HR 90

## 2019-06-24 DIAGNOSIS — Z3493 Encounter for supervision of normal pregnancy, unspecified, third trimester: Secondary | ICD-10-CM

## 2019-06-24 DIAGNOSIS — B009 Herpesviral infection, unspecified: Secondary | ICD-10-CM

## 2019-06-24 DIAGNOSIS — A599 Trichomoniasis, unspecified: Secondary | ICD-10-CM

## 2019-06-24 DIAGNOSIS — O98512 Other viral diseases complicating pregnancy, second trimester: Secondary | ICD-10-CM

## 2019-06-24 DIAGNOSIS — Z3A15 15 weeks gestation of pregnancy: Secondary | ICD-10-CM

## 2019-06-24 DIAGNOSIS — Z8619 Personal history of other infectious and parasitic diseases: Secondary | ICD-10-CM

## 2019-06-24 NOTE — Patient Instructions (Signed)
Fetal Movement Counts Patient Name: ________________________________________________ Patient Due Date: ____________________ What is a fetal movement count?  A fetal movement count is the number of times that you feel your baby move during a certain amount of time. This may also be called a fetal kick count. A fetal movement count is recommended for every pregnant woman. You may be asked to start counting fetal movements as early as week 28 of your pregnancy. Pay attention to when your baby is most active. You may notice your baby's sleep and wake cycles. You may also notice things that make your baby move more. You should do a fetal movement count:  When your baby is normally most active.  At the same time each day. A good time to count movements is while you are resting, after having something to eat and drink. How do I count fetal movements? 1. Find a quiet, comfortable area. Sit, or lie down on your side. 2. Write down the date, the start time and stop time, and the number of movements that you felt between those two times. Take this information with you to your health care visits. 3. Write down your start time when you feel the first movement. 4. Count kicks, flutters, swishes, rolls, and jabs. You should feel at least 10 movements. 5. You may stop counting after you have felt 10 movements, or if you have been counting for 2 hours. Write down the stop time. 6. If you do not feel 10 movements in 2 hours, contact your health care provider for further instructions. Your health care provider may want to do additional tests to assess your baby's well-being. Contact a health care provider if:  You feel fewer than 10 movements in 2 hours.  Your baby is not moving like he or she usually does. Date: ____________ Start time: ____________ Stop time: ____________ Movements: ____________ Date: ____________ Start time: ____________ Stop time: ____________ Movements: ____________ Date: ____________  Start time: ____________ Stop time: ____________ Movements: ____________ Date: ____________ Start time: ____________ Stop time: ____________ Movements: ____________ Date: ____________ Start time: ____________ Stop time: ____________ Movements: ____________ Date: ____________ Start time: ____________ Stop time: ____________ Movements: ____________ Date: ____________ Start time: ____________ Stop time: ____________ Movements: ____________ Date: ____________ Start time: ____________ Stop time: ____________ Movements: ____________ Date: ____________ Start time: ____________ Stop time: ____________ Movements: ____________ This information is not intended to replace advice given to you by your health care provider. Make sure you discuss any questions you have with your health care provider. Document Revised: 01/22/2019 Document Reviewed: 01/22/2019 Elsevier Patient Education  2020 Elsevier Inc. Braxton Hicks Contractions Contractions of the uterus can occur throughout pregnancy, but they are not always a sign that you are in labor. You may have practice contractions called Braxton Hicks contractions. These false labor contractions are sometimes confused with true labor. What are Braxton Hicks contractions? Braxton Hicks contractions are tightening movements that occur in the muscles of the uterus before labor. Unlike true labor contractions, these contractions do not result in opening (dilation) and thinning of the cervix. Toward the end of pregnancy (32-34 weeks), Braxton Hicks contractions can happen more often and may become stronger. These contractions are sometimes difficult to tell apart from true labor because they can be very uncomfortable. You should not feel embarrassed if you go to the hospital with false labor. Sometimes, the only way to tell if you are in true labor is for your health care provider to look for changes in the cervix. The health care provider   will do a physical exam and may  monitor your contractions. If you are not in true labor, the exam should show that your cervix is not dilating and your water has not broken. If there are no other health problems associated with your pregnancy, it is completely safe for you to be sent home with false labor. You may continue to have Braxton Hicks contractions until you go into true labor. How to tell the difference between true labor and false labor True labor  Contractions last 30-70 seconds.  Contractions become very regular.  Discomfort is usually felt in the top of the uterus, and it spreads to the lower abdomen and low back.  Contractions do not go away with walking.  Contractions usually become more intense and increase in frequency.  The cervix dilates and gets thinner. False labor  Contractions are usually shorter and not as strong as true labor contractions.  Contractions are usually irregular.  Contractions are often felt in the front of the lower abdomen and in the groin.  Contractions may go away when you walk around or change positions while lying down.  Contractions get weaker and are shorter-lasting as time goes on.  The cervix usually does not dilate or become thin. Follow these instructions at home:   Take over-the-counter and prescription medicines only as told by your health care provider.  Keep up with your usual exercises and follow other instructions from your health care provider.  Eat and drink lightly if you think you are going into labor.  If Braxton Hicks contractions are making you uncomfortable: ? Change your position from lying down or resting to walking, or change from walking to resting. ? Sit and rest in a tub of warm water. ? Drink enough fluid to keep your urine pale yellow. Dehydration may cause these contractions. ? Do slow and deep breathing several times an hour.  Keep all follow-up prenatal visits as told by your health care provider. This is important. Contact a  health care provider if:  You have a fever.  You have continuous pain in your abdomen. Get help right away if:  Your contractions become stronger, more regular, and closer together.  You have fluid leaking or gushing from your vagina.  You pass blood-tinged mucus (bloody show).  You have bleeding from your vagina.  You have low back pain that you never had before.  You feel your baby's head pushing down and causing pelvic pressure.  Your baby is not moving inside you as much as it used to. Summary  Contractions that occur before labor are called Braxton Hicks contractions, false labor, or practice contractions.  Braxton Hicks contractions are usually shorter, weaker, farther apart, and less regular than true labor contractions. True labor contractions usually become progressively stronger and regular, and they become more frequent.  Manage discomfort from Braxton Hicks contractions by changing position, resting in a warm bath, drinking plenty of water, or practicing deep breathing. This information is not intended to replace advice given to you by your health care provider. Make sure you discuss any questions you have with your health care provider. Document Revised: 05/17/2017 Document Reviewed: 10/18/2016 Elsevier Patient Education  2020 Elsevier Inc.  

## 2019-06-24 NOTE — Progress Notes (Signed)
I connected with  [name] on 06/24/19 at  4:15 PM EST by telephone and verified that I am speaking with the correct person using two identifiers.   I discussed the limitations, risks, security and privacy concerns of performing an evaluation and management service by telephone and the availability of in person appointments. I also discussed with the patient that there may be a patient responsible charge related to this service. The patient expressed understanding and agreed to proceed.  [name], CMA 06/24/2019  3:56 PM

## 2019-06-24 NOTE — Progress Notes (Signed)
I connected with Rebekah Lloyd on 06/24/19 at  4:15 PM EST by: MyChart and verified that I am speaking with the correct person using two identifiers.  Patient is located at home and provider is located at [facility].     The purpose of this virtual visit is to provide medical care while limiting exposure to the novel coronavirus. I discussed the limitations, risks, security and privacy concerns of performing an evaluation and management service by MyChart and the availability of in person appointments. I also discussed with the patient that there may be a patient responsible charge related to this service. By engaging in this virtual visit, you consent to the provision of healthcare.  Additionally, you authorize for your insurance to be billed for the services provided during this visit.  The patient expressed understanding and agreed to proceed.  The following staff members participated in the virtual visit:  Corinda Gubler, CMA    PRENATAL VISIT NOTE  Subjective:  Rebekah Lloyd is a 24 y.o. G2P1001 at [redacted]w[redacted]d  for phone visit for ongoing prenatal care.  She is currently monitored for the following issues for this low-risk pregnancy and has Supervision of low-risk pregnancy; History of chlamydia; Trichomonas infection; and HSV-2 infection complicating pregnancy on their problem list.  Patient reports no complaints.  Contractions: Not present. Vag. Bleeding: None.  Movement: Present. Denies leaking of fluid.   The following portions of the patient's history were reviewed and updated as appropriate: allergies, current medications, past family history, past medical history, past social history, past surgical history and problem list.   Objective:   Vitals:   06/24/19 1557  BP: 121/80  Pulse: 90   Self-Obtained  Fetal Status:     Movement: Present     Assessment and Plan:  Pregnancy: G2P1001 at [redacted]w[redacted]d 1. Encounter for supervision of low-risk pregnancy in third trimester  2. 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 History of chlamydia - TOC negative   4. Trichomonas infection - Previous treated   Preterm labor symptoms and general obstetric precautions including but not limited to vaginal bleeding, contractions, leaking of fluid and fetal movement were reviewed in detail with the patient.  Return in about 2 weeks (around 07/08/2019) for LOB, Virtual.  No future appointments.   Time spent on virtual visit: 10 minutes  Vonzella Nipple, PA-C

## 2019-07-09 ENCOUNTER — Telehealth (INDEPENDENT_AMBULATORY_CARE_PROVIDER_SITE_OTHER): Payer: Medicaid Other | Admitting: Obstetrics and Gynecology

## 2019-07-09 VITALS — BP 121/80

## 2019-07-09 DIAGNOSIS — O98513 Other viral diseases complicating pregnancy, third trimester: Secondary | ICD-10-CM

## 2019-07-09 DIAGNOSIS — Z3A33 33 weeks gestation of pregnancy: Secondary | ICD-10-CM | POA: Diagnosis not present

## 2019-07-09 DIAGNOSIS — Z3493 Encounter for supervision of normal pregnancy, unspecified, third trimester: Secondary | ICD-10-CM

## 2019-07-09 DIAGNOSIS — B009 Herpesviral infection, unspecified: Secondary | ICD-10-CM

## 2019-07-09 MED ORDER — VALACYCLOVIR HCL 500 MG PO TABS: 500.0000 mg | ORAL_TABLET | Freq: Two times a day (BID) | ORAL | 1 refills | Status: DC

## 2019-07-09 NOTE — Progress Notes (Signed)
I connected with  Rebekah Lloyd on 07/09/19 at 1601 by telephone and verified that I am speaking with the correct person using two identifiers.   I discussed the limitations, risks, security and privacy concerns of performing an evaluation and management service by telephone and the availability of in person appointments. I also discussed with the patient that there may be a patient responsible charge related to this service. The patient expressed understanding and agreed to proceed.  Marjo Bicker, RN 07/09/2019  4:01 PM

## 2019-07-09 NOTE — Progress Notes (Signed)
   TELEHEALTH OBSTETRICS PRENATAL VIRTUAL VIDEO VISIT ENCOUNTER NOTE  Provider location: Center for [facility] [place]   I connected with Rebekah Lloyd on 07/09/19 at  4:15 PM EST by WebEx Encounter at home and verified that I am speaking with the correct person using two identifiers.   I discussed the limitations, risks, security and privacy concerns of performing an evaluation and management service virtually and the availability of in person appointments. I also discussed with the patient that there may be a patient responsible charge related to this service. The patient expressed understanding and agreed to proceed. Subjective:  Rebekah Lloyd is a 24 y.o. G2P1001 at [redacted]w[redacted]d being seen today for ongoing prenatal care.  She is currently monitored for the following issues for this low-risk pregnancy and has Supervision of low-risk pregnancy; History of chlamydia; Trichomonas infection; and HSV-2 infection complicating pregnancy on their problem list.  Patient reports no complaints.  Contractions: Irritability. Vag. Bleeding: None.  Movement: Present. Denies any leaking of fluid.   The following portions of the patient's history were reviewed and updated as appropriate: allergies, current medications, past family history, past medical history, past social history, past surgical history and problem list.   Objective:   Vitals:   07/09/19 1602  BP: 121/80    Fetal Status:     Movement: Present     General:  Alert, oriented and cooperative. Patient is in no acute distress.  Respiratory: Normal respiratory effort, no problems with respiration noted  Mental Status: Normal mood and affect. Normal behavior. Normal judgment and thought content.  Rest of physical exam deferred due to type of encounter  Imaging: No results found.  Assessment and Plan:  Pregnancy: G2P1001 at [redacted]w[redacted]d  1. Encounter for supervision of low-risk pregnancy in third trimester  Discussed importance of  check BP at home. In person visit next for GBS  2. 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   Preterm labor symptoms and general obstetric precautions including but not limited to vaginal bleeding, contractions, leaking of fluid and fetal movement were reviewed in detail with the patient. I discussed the assessment and treatment plan with the patient. The patient was provided an opportunity to ask questions and all were answered. The patient agreed with the plan and demonstrated an understanding of the instructions. The patient was advised to call back or seek an in-person office evaluation/go to MAU at [facility] for any urgent or concerning symptoms. Please refer to After Visit Summary for other counseling recommendations.   I provided 10 minutes of face-to-face time during this encounter.  Return for 36 weeks for in-person visit for GBS.  No future appointments.  Venia Carbon, NP Center for [employer], [facility]

## 2019-07-28 ENCOUNTER — Other Ambulatory Visit: Payer: Self-pay

## 2019-07-28 ENCOUNTER — Encounter: Payer: Self-pay | Admitting: Family Medicine

## 2019-07-28 ENCOUNTER — Ambulatory Visit (INDEPENDENT_AMBULATORY_CARE_PROVIDER_SITE_OTHER): Payer: Medicaid Other | Admitting: Advanced Practice Midwife

## 2019-07-28 ENCOUNTER — Other Ambulatory Visit (HOSPITAL_COMMUNITY)
Admission: RE | Admit: 2019-07-28 | Discharge: 2019-07-28 | Disposition: A | Discharge status: Home / Self Care | Payer: Medicaid Other | Source: Ambulatory Visit | Attending: Advanced Practice Midwife | Admitting: Advanced Practice Midwife

## 2019-07-28 VITALS — BP 116/65 | HR 82 | Wt 159.4 lb

## 2019-07-28 DIAGNOSIS — Z3A36 36 weeks gestation of pregnancy: Secondary | ICD-10-CM

## 2019-07-28 DIAGNOSIS — Z3493 Encounter for supervision of normal pregnancy, unspecified, third trimester: Secondary | ICD-10-CM | POA: Diagnosis not present

## 2019-07-28 DIAGNOSIS — B009 Herpesviral infection, unspecified: Secondary | ICD-10-CM

## 2019-07-28 DIAGNOSIS — O98513 Other viral diseases complicating pregnancy, third trimester: Secondary | ICD-10-CM

## 2019-07-28 NOTE — Patient Instructions (Addendum)
Group B Streptococcus Test During Pregnancy Why am I having this test? Routine testing, also called screening, for group B streptococcus (GBS) is recommended for all pregnant women between the 36th and 37th week of pregnancy. GBS is a type of bacteria that can be passed from mother to baby during childbirth. Screening will help guide whether or not you will need treatment during labor and delivery to prevent complications such as:  An infection in your uterus during labor.  An infection in your uterus after delivery.  A serious infection in your baby after delivery, such as pneumonia, meningitis, or sepsis. GBS screening is not often done before 36 weeks of pregnancy unless you go into labor prematurely. What happens if I have group B streptococcus? If testing shows that you have GBS, your health care provider will recommend treatment with IV antibiotics during labor and delivery. This treatment significantly decreases the risk of complications for you and your baby. If you have a planned C-section and you have GBS, you may not need to be treated with antibiotics because GBS is usually passed to babies after labor starts and your water breaks. If you are in labor or your water breaks before your C-section, it is possible for GBS to get into your uterus and be passed to your baby, so you might need treatment. Is there a chance I may not need to be tested? You may not need to be tested for GBS if:  You have a urine test that shows GBS before 36 to 37 weeks.  You had a baby with GBS infection after a previous delivery. In these cases, you will automatically be treated for GBS during labor and delivery. What is being tested? This test is done to check if you have group B streptococcus in your vagina or rectum. What kind of sample is taken? To collect samples for this test, your health care provider will swab your vagina and rectum with a cotton swab. The sample is then sent to the lab to see if  GBS is present. What happens during the test?   You will remove your clothing from the waist down.  You will lie down on an exam table in the same position as you would for a pelvic exam.  Your health care provider will swab your vagina and rectum to collect samples for a culture test.  You will be able to go home after the test and do all your usual activities. How are the results reported? The test results are reported as positive or negative. What do the results mean?  A positive test means you are at risk for passing GBS to your baby during labor and delivery. Your health care provider will recommend that you are treated with an IV antibiotic during labor and delivery.  A negative test means you are at very low risk of passing GBS to your baby. There is still a low risk of passing GBS to your baby because sometimes test results may report that you do not have a condition when you do (false-negative result) or there is a chance that you may become infected with GBS after the test is done. You most likely will not need to be treated with an antibiotic during labor and delivery. Talk with your health care provider about what your results mean. Questions to ask your health care provider Ask your health care provider, or the department that is doing the test:  When will my results be ready?  How will I   get my results?  What are my treatment options? Summary  Routine testing (screening) for group B streptococcus (GBS) is recommended for all pregnant women between the 36th and 37th week of pregnancy.  GBS is a type of bacteria that can be passed from mother to baby during childbirth.  If testing shows that you have GBS, your health care provider will recommend that you are treated with IV antibiotics during labor and delivery. This treatment almost always prevents infection in newborns. This information is not intended to replace advice given to you by your health care provider. Make  sure you discuss any questions you have with your health care provider. Document Revised: 09/25/2018 Document Reviewed: 07/02/2018 Elsevier Patient Education  2020 [employer] Inc[employer]  Signs and Symptoms of Labor Labor is your body's natural process of moving your baby, placenta, and umbilical cord out of your uterus. The process of labor usually starts when your baby is full-term, between 37 and 40 weeks of pregnancy. How will I know when I am close to going into labor? As your body prepares for labor and the birth of your baby, you may notice the following symptoms in the weeks and days before true labor starts:  Having a strong desire to get your home ready to receive your new baby. This is called nesting. Nesting may be a sign that labor is approaching, and it may occur several weeks before birth. Nesting may involve cleaning and organizing your home.  Passing a small amount of thick, bloody mucus out of your vagina (normal bloody show or losing your mucus plug). This may happen more than a week before labor begins, or it might occur right before labor begins as the opening of the cervix starts to widen (dilate). For some women, the entire mucus plug passes at once. For others, smaller portions of the mucus plug may gradually pass over several days.  Your baby moving (dropping) lower in your pelvis to get into position for birth (lightening). When this happens, you may feel more pressure on your bladder and pelvic bone and less pressure on your ribs. This may make it easier to breathe. It may also cause you to need to urinate more often and have problems with bowel movements.  Having "practice contractions" (Braxton Hicks contractions) that occur at irregular (unevenly spaced) intervals that are more than 10 minutes apart. This is also called false labor. False labor contractions are common after exercise or sexual activity, and they will stop if you change position, rest, or drink fluids. These  contractions are usually mild and do not get stronger over time. They may feel like: ? A backache or back pain. ? Mild cramps, similar to menstrual cramps. ? Tightening or pressure in your abdomen. Other early symptoms that labor may be starting soon include:  Nausea or loss of appetite.  Diarrhea.  Having a sudden burst of energy, or feeling very tired.  Mood changes.  Having trouble sleeping. How will I know when labor has begun? Signs that true labor has begun may include:  Having contractions that come at regular (evenly spaced) intervals and increase in intensity. This may feel like more intense tightening or pressure in your abdomen that moves to your back. ? Contractions may also feel like rhythmic pain in your upper thighs or back that comes and goes at regular intervals. ? For first-time mothers, this change in intensity of contractions often occurs at a more gradual pace. ? Women who have given birth before may notice   may notice a more rapid progression of contraction changes.  Having a feeling of pressure in the vaginal area.  Your water breaking (rupture of membranes). This is when the sac of fluid that surrounds your baby breaks. When this happens, you will notice fluid leaking from your vagina. This may be clear or blood-tinged. Labor usually starts within 24 hours of your water breaking, but it may take longer to begin. ? Some women notice this as a gush of fluid. ? Others notice that their underwear repeatedly becomes damp. Follow these instructions at home:   When labor starts, or if your water breaks, call your health care provider or nurse care line. Based on your situation, they will determine when you should go in for an exam.  When you are in early labor, you may be able to rest and manage symptoms at home. Some strategies to try at home include: ? Breathing and relaxation techniques. ? Taking a warm bath or shower. ? Listening to music. ? Using a heating pad on the  lower back for pain. If you are directed to use heat:  Place a towel between your skin and the heat source.  Leave the heat on for 20-30 minutes.  Remove the heat if your skin turns bright red. This is especially important if you are unable to feel pain, heat, or cold. You may have a greater risk of getting burned. Get help right away if:  You have painful, regular contractions that are 5 minutes apart or less.  Labor starts before you are [redacted] weeks along in your pregnancy.  You have a fever.  You have a headache that does not go away.  You have bright red blood coming from your vagina.  You do not feel your baby moving.  You have a sudden onset of: ? Severe headache with vision problems. ? Nausea, vomiting, or diarrhea. ? Chest pain or shortness of breath. These symptoms may be an emergency. If your health care provider recommends that you go to the hospital or birth center where you plan to deliver, do not drive yourself. Have someone else drive you, or call emergency services (911 in the [place] Summary  Labor is your body's natural process of moving your baby, placenta, and umbilical cord out of your uterus.  The process of labor usually starts when your baby is full-term, between 5 and 40 weeks of pregnancy.  When labor starts, or if your water breaks, call your health care provider or nurse care line. Based on your situation, they will determine when you should go in for an exam. This information is not intended to replace advice given to you by your health care provider. Make sure you discuss any questions you have with your health care provider. Document Revised: 03/04/2017 Document Reviewed: 11/09/2016 Elsevier Patient Education  2020 [employer].

## 2019-07-28 NOTE — Progress Notes (Signed)
   PRENATAL VISIT NOTE  Subjective:  Rebekah Lloyd is a 24 y.o. G2P1001 at [redacted]w[redacted]d being seen today for ongoing prenatal care.  She is currently monitored for the following issues for this low-risk pregnancy and has Supervision of low-risk pregnancy; History of chlamydia; Trichomonas infection; and HSV-2 infection complicating pregnancy on their problem list.  Patient reports no complaints.  Contractions: Irritability. Vag. Bleeding: None.  Movement: Present. Denies leaking of fluid.   The following portions of the patient's history were reviewed and updated as appropriate: allergies, current medications, past family history, past medical history, past social history, past surgical history and problem list. Problem list updated.  Objective:   Vitals:   07/28/19 1047  BP: 116/65  Pulse: 82  Weight: 159 lb 6.4 oz (72.3 kg)    Fetal Status: Fetal Heart Rate (bpm): 132 Fundal Height: 36 cm Movement: Present  Presentation: Vertex  General:  Alert, oriented and cooperative. Patient is in no acute distress.  Skin: Skin is warm and dry. No rash noted.   Cardiovascular: Normal heart rate noted  Respiratory: Normal respiratory effort, no problems with respiration noted  Abdomen: Soft, gravid, appropriate for gestational age.  Pain/Pressure: Present     Pelvic: Cervical exam performed Dilation: Closed Effacement (%): Thick Station: Ballotable  Extremities: Normal range of motion.  Edema: None  Mental Status: Normal mood and affect. Normal behavior. Normal judgment and thought content.   Assessment and Plan:  Pregnancy: G2P1001 at [redacted]w[redacted]d  1. Encounter for supervision of low-risk pregnancy in third trimester - LOB. Confirmed MAU location for labor check - GC/Chlamydia probe amp ([place])not at [facility] - Culture, beta strep (group b only)  2. Herpes simplex virus type 2 (HSV-2) infection affecting pregnancy in third trimester - Initiate prophylaxis today - No lesions noted  Preterm labor  symptoms and general obstetric precautions including but not limited to vaginal bleeding, contractions, leaking of fluid and fetal movement were reviewed in detail with the patient. Please refer to After Visit Summary for other counseling recommendations.    Future Appointments  Date Time Provider Department Center  08/04/2019  2:35 PM Mcneil Sober [facility] WOC  08/10/2019 10:35 AM Armando Reichert, CNM [facility]OC[facility] WOC  08/17/2019  2:15 PM Kathlene Cote [facility] WOC  08/26/2019  8:15 AM [facility]OC[facility] NST [facility]OC[facility] WOC  08/26/2019  9:15 AM Judeth Horn, NP [facility] WOC    [place], [place]

## 2019-07-29 ENCOUNTER — Other Ambulatory Visit: Payer: Medicaid Other

## 2019-07-29 LAB — GC/CHLAMYDIA PROBE AMP (~~LOC~~) NOT AT ARMC
Chlamydia: NEGATIVE
Comment: NEGATIVE
Comment: NORMAL
Neisseria Gonorrhea: NEGATIVE

## 2019-08-01 LAB — CULTURE, BETA STREP (GROUP B ONLY): Strep Gp B Culture: NEGATIVE

## 2019-08-04 ENCOUNTER — Telehealth (INDEPENDENT_AMBULATORY_CARE_PROVIDER_SITE_OTHER): Payer: Medicaid Other | Admitting: Certified Nurse Midwife

## 2019-08-04 ENCOUNTER — Other Ambulatory Visit: Payer: Self-pay

## 2019-08-04 ENCOUNTER — Encounter: Payer: Self-pay | Admitting: Certified Nurse Midwife

## 2019-08-04 VITALS — BP 118/60

## 2019-08-04 DIAGNOSIS — B009 Herpesviral infection, unspecified: Secondary | ICD-10-CM

## 2019-08-04 DIAGNOSIS — O98519 Other viral diseases complicating pregnancy, unspecified trimester: Secondary | ICD-10-CM

## 2019-08-04 DIAGNOSIS — Z3A37 37 weeks gestation of pregnancy: Secondary | ICD-10-CM

## 2019-08-04 DIAGNOSIS — Z8619 Personal history of other infectious and parasitic diseases: Secondary | ICD-10-CM

## 2019-08-04 DIAGNOSIS — Z3490 Encounter for supervision of normal pregnancy, unspecified, unspecified trimester: Secondary | ICD-10-CM

## 2019-08-04 DIAGNOSIS — O98513 Other viral diseases complicating pregnancy, third trimester: Secondary | ICD-10-CM

## 2019-08-04 NOTE — Progress Notes (Signed)
   TELEHEALTH OBSTETRICS PRENATAL VIRTUAL VIDEO VISIT ENCOUNTER NOTE  Provider location: Center for [facility] [place]   I connected with [name] on 08/04/19 at  2:35 PM EST by MyChart Video Encounter at home and verified that I am speaking with the correct person using two identifiers.   I discussed the limitations, risks, security and privacy concerns of performing an evaluation and management service virtually and the availability of in person appointments. I also discussed with the patient that there may be a patient responsible charge related to this service. The patient expressed understanding and agreed to proceed. Subjective:  Rebekah Lloyd is a 24 y.o. G2P1001 at [redacted]w[redacted]d being seen today for ongoing prenatal care.  She is currently monitored for the following issues for this low-risk pregnancy and has Supervision of low-risk pregnancy; History of chlamydia; Trichomonas infection; and HSV-2 infection complicating pregnancy on their problem list.  Patient reports no complaints.  Contractions: Irritability. Vag. Bleeding: None.  Movement: Present. Denies any leaking of fluid.   The following portions of the patient's history were reviewed and updated as appropriate: allergies, current medications, past family history, past medical history, past social history, past surgical history and problem list.   Objective:   Vitals:   08/04/19 1424  BP: 118/60    Fetal Status:     Movement: Present     General:  Alert, oriented and cooperative. Patient is in no acute distress.  Respiratory: Normal respiratory effort, no problems with respiration noted  Mental Status: Normal mood and affect. Normal behavior. Normal judgment and thought content.  Rest of physical exam deferred due to type of encounter  Imaging: No results found.  Assessment and Plan:  Pregnancy: G2P1001 at [redacted]w[redacted]d 1. Encounter for supervision of low-risk pregnancy, antepartum - Patient doing well, counting  down and ready to have baby  - Routine prenatal care - Anticipatory guidance on upcoming appointments, next one already scheduled for mychart appointment  - Educated and discussed use of EPO, RRT, IC, walking, squats and lunges to stimulate contractions and cervical ripening.   2. History of chlamydia - TOC negative 12/8 and third trimester swabs negative   3. Herpes simplex type 2 (HSV-2) infection affecting pregnancy, antepartum - Currently on suppression and is taking regularly  - Patient denies recent outbreak    Term labor symptoms and general obstetric precautions including but not limited to vaginal bleeding, contractions, leaking of fluid and fetal movement were reviewed in detail with the patient. I discussed the assessment and treatment plan with the patient. The patient was provided an opportunity to ask questions and all were answered. The patient agreed with the plan and demonstrated an understanding of the instructions. The patient was advised to call back or seek an in-person office evaluation/go to MAU at [facility] for any urgent or concerning symptoms. Please refer to After Visit Summary for other counseling recommendations.   I provided 10 minutes of face-to-face time during this encounter.  Return in about 1 week (around 08/11/2019) for ROB-mychart.  Future Appointments  Date Time Provider Department Center  08/10/2019 10:35 AM Armando Reichert, CNM WOC[facility] WOC  08/17/2019  2:15 PM Kathlene Cote [facility] WOC  08/26/2019  8:15 AM WOC-WOCA NST WOC-[facility] WOC  08/26/2019  9:15 AM Judeth Horn, NP WOC-[facility] WOC    Sharyon Cable, CNM Center for [employer], [facility]

## 2019-08-04 NOTE — Patient Instructions (Signed)

## 2019-08-04 NOTE — Progress Notes (Signed)
I connected with  Rebekah Lloyd on 08/04/19 at  2:35 PM EST by telephone and verified that I am speaking with the correct person using two identifiers.   I discussed the limitations, risks, security and privacy concerns of performing an evaluation and management service by telephone and the availability of in person appointments. I also discussed with the patient that there may be a patient responsible charge related to this service. The patient expressed understanding and agreed to proceed.  Janene Madeira Austina Constantin, CMA 08/04/2019  2:21 PM

## 2019-08-07 ENCOUNTER — Inpatient Hospital Stay (HOSPITAL_COMMUNITY)
Admission: AD | Admit: 2019-08-07 | Discharge: 2019-08-07 | Disposition: A | Discharge status: Home / Self Care | Payer: Medicaid Other | Attending: Obstetrics & Gynecology | Admitting: Obstetrics & Gynecology

## 2019-08-07 ENCOUNTER — Other Ambulatory Visit: Payer: Self-pay

## 2019-08-07 ENCOUNTER — Encounter (HOSPITAL_COMMUNITY): Payer: Self-pay | Admitting: Obstetrics & Gynecology

## 2019-08-07 DIAGNOSIS — O471 False labor at or after 37 completed weeks of gestation: Secondary | ICD-10-CM | POA: Insufficient documentation

## 2019-08-07 DIAGNOSIS — Z3490 Encounter for supervision of normal pregnancy, unspecified, unspecified trimester: Secondary | ICD-10-CM

## 2019-08-07 DIAGNOSIS — O479 False labor, unspecified: Secondary | ICD-10-CM

## 2019-08-07 DIAGNOSIS — Z3A37 37 weeks gestation of pregnancy: Secondary | ICD-10-CM

## 2019-08-07 DIAGNOSIS — Z0371 Encounter for suspected problem with amniotic cavity and membrane ruled out: Secondary | ICD-10-CM | POA: Diagnosis not present

## 2019-08-07 LAB — URINALYSIS, ROUTINE W REFLEX MICROSCOPIC
Bilirubin Urine: NEGATIVE
Glucose, UA: NEGATIVE mg/dL
Hgb urine dipstick: NEGATIVE
Ketones, ur: NEGATIVE mg/dL
Nitrite: NEGATIVE
Protein, ur: NEGATIVE mg/dL
Specific Gravity, Urine: 1.008 (ref 1.005–1.030)
pH: 7 (ref 5.0–8.0)

## 2019-08-07 LAB — POCT FERN TEST: POCT Fern Test: NEGATIVE

## 2019-08-07 NOTE — MAU Provider Note (Signed)
None      S: Ms. Rebekah Lloyd is a 24 y.o. G2P1001 at [redacted]w[redacted]d  who presents to MAU today complaining of leaking of fluid x 1 episode 2 days ago. She denies vaginal bleeding. She endorses contractions. She reports normal fetal movement.    O: BP 125/72 (BP Location: Right Arm)   Pulse (!) 107   Temp 99.1 F (37.3 C) (Oral)   Resp 18   Ht 5\' 7"  (1.702 m)   Wt 72.6 kg   LMP 11/17/2018 (Exact Date)   SpO2 100% Comment: room air  BMI 25.06 kg/m  GENERAL: Well-developed, well-nourished female in no acute distress.  HEAD: Normocephalic, atraumatic.  CHEST: Normal effort of breathing, regular heart rate ABDOMEN: Soft, nontender, gravid PELVIC: Normal external female genitalia. Vagina is pink and rugated. Cervix with normal contour, no lesions. Normal discharge.  negative pooling.   Ferning slide negative  Cervical exam:  Dilation: 1 Effacement (%): 40 Presentation: Vertex Exam by:: L Leftwich-Kirby   Fetal Monitoring: Baseline: 135 Variability: moderate Accelerations: present Decelerations: none Contractions: Q 4-10 minutes, irregular, mild to palpation    Results for orders placed or performed during the hospital encounter of 08/07/19 (from the past 24 hour(s))  Urinalysis, Routine w reflex microscopic     Status: Abnormal   Collection Time: 08/07/19  7:05 PM  Result Value Ref Range   Color, Urine YELLOW YELLOW   APPearance HAZY (A) CLEAR   Specific Gravity, Urine 1.008 1.005 - 1.030   pH 7.0 5.0 - 8.0   Glucose, UA NEGATIVE NEGATIVE mg/dL   Hgb urine dipstick NEGATIVE NEGATIVE   Bilirubin Urine NEGATIVE NEGATIVE   Ketones, ur NEGATIVE NEGATIVE mg/dL   Protein, ur NEGATIVE NEGATIVE mg/dL   Nitrite NEGATIVE NEGATIVE   Leukocytes,Ua SMALL (A) NEGATIVE   RBC / HPF 0-5 0 - 5 RBC/hpf   WBC, UA 0-5 0 - 5 WBC/hpf   Bacteria, UA MANY (A) NONE SEEN   Squamous Epithelial / LPF 0-5 0 - 5     A: SIUP at [redacted]w[redacted]d  Membranes intact  P: D/C home with labor  precautions  [redacted]w[redacted]d, CNM 08/07/2019 8:48 PM

## 2019-08-07 NOTE — MAU Note (Signed)
Pt reports that 2 days ago she felt a single gush of fluid that was just damp in underwear. She has had contraction for a few days. Now 3-5 mins. Rating pain 4/10. Denies VB. +FM

## 2019-08-10 ENCOUNTER — Telehealth (INDEPENDENT_AMBULATORY_CARE_PROVIDER_SITE_OTHER): Payer: Medicaid Other | Admitting: Advanced Practice Midwife

## 2019-08-10 ENCOUNTER — Encounter: Payer: Self-pay | Admitting: Advanced Practice Midwife

## 2019-08-10 VITALS — BP 120/58 | HR 78

## 2019-08-10 DIAGNOSIS — Z3480 Encounter for supervision of other normal pregnancy, unspecified trimester: Secondary | ICD-10-CM | POA: Diagnosis not present

## 2019-08-10 NOTE — Progress Notes (Signed)
   TELEHEALTH VIRTUAL OBSTETRICS VISIT ENCOUNTER NOTE  I connected with Rebekah Lloyd on 08/10/19 at 10:35 AM EST by telephone at home and verified that I am speaking with the correct person using two identifiers.   I discussed the limitations, risks, security and privacy concerns of performing an evaluation and management service by telephone and the availability of in person appointments. I also discussed with the patient that there may be a patient responsible charge related to this service. The patient expressed understanding and agreed to proceed.  Subjective:  Rebekah Lloyd is a 24 y.o. G2P1001 at [redacted]w[redacted]d being followed for ongoing prenatal care.  She is currently monitored for the following issues for this low-risk pregnancy and has Supervision of low-risk pregnancy; History of chlamydia; Trichomonas infection; and HSV-2 infection complicating pregnancy on their problem list.  Patient reports no complaints. Reports fetal movement. Denies any contractions, bleeding or leaking of fluid.   The following portions of the patient's history were reviewed and updated as appropriate: allergies, current medications, past family history, past medical history, past social history, past surgical history and problem list.   Objective:   General:  Alert, oriented and cooperative.   Mental Status: Normal mood and affect perceived. Normal judgment and thought content.  Rest of physical exam deferred due to type of encounter   BP: 123/68 today   Assessment and Plan:  Pregnancy: G2P1001 at [redacted]w[redacted]d 1. Supervision of other normal pregnancy, antepartum -routine care  Term labor symptoms and general obstetric precautions including but not limited to vaginal bleeding, contractions, leaking of fluid and fetal movement were reviewed in detail with the patient.  I discussed the assessment and treatment plan with the patient. The patient was provided an opportunity to ask questions and all were answered.  The patient agreed with the plan and demonstrated an understanding of the instructions. The patient was advised to call back or seek an in-person office evaluation/go to MAU at [facility] for any urgent or concerning symptoms. Please refer to After Visit Summary for other counseling recommendations.   I provided 12 minutes of non-face-to-face time during this encounter.  Return in about 1 week (around 08/17/2019) for in person visit .  Future Appointments  Date Time Provider Department Center  08/17/2019  2:15 PM Kathlene Cote [facility] WOC  08/26/2019  8:15 AM WOC[facility] NST [facility] WOC  08/26/2019  9:15 AM Judeth Horn, NP [facility]    Thressa Sheller DNP, CNM  08/10/19  11:20 AM  Center for [employer], [facility]

## 2019-08-13 ENCOUNTER — Encounter: Payer: Self-pay | Admitting: Student

## 2019-08-17 ENCOUNTER — Ambulatory Visit (INDEPENDENT_AMBULATORY_CARE_PROVIDER_SITE_OTHER): Payer: Medicaid Other | Admitting: Medical

## 2019-08-17 ENCOUNTER — Other Ambulatory Visit: Payer: Self-pay

## 2019-08-17 VITALS — BP 116/73 | HR 96 | Wt 158.6 lb

## 2019-08-17 DIAGNOSIS — A599 Trichomoniasis, unspecified: Secondary | ICD-10-CM

## 2019-08-17 DIAGNOSIS — Z3A39 39 weeks gestation of pregnancy: Secondary | ICD-10-CM

## 2019-08-17 DIAGNOSIS — Z3493 Encounter for supervision of normal pregnancy, unspecified, third trimester: Secondary | ICD-10-CM

## 2019-08-17 DIAGNOSIS — Z8619 Personal history of other infectious and parasitic diseases: Secondary | ICD-10-CM

## 2019-08-17 DIAGNOSIS — O98513 Other viral diseases complicating pregnancy, third trimester: Secondary | ICD-10-CM

## 2019-08-17 DIAGNOSIS — B009 Herpesviral infection, unspecified: Secondary | ICD-10-CM

## 2019-08-17 NOTE — Progress Notes (Signed)
IOL scheduled on 08/31/19 in the am.

## 2019-08-17 NOTE — Progress Notes (Signed)
   PRENATAL VISIT NOTE  Subjective:  Rebekah Lloyd is a 24 y.o. G2P1001 at [redacted]w[redacted]d being seen today for ongoing prenatal care.  She is currently monitored for the following issues for this low-risk pregnancy and has Supervision of low-risk pregnancy; History of chlamydia; Trichomonas infection; and HSV-2 infection complicating pregnancy on their problem list.  Patient reports occasional contractions.  Contractions: Irritability. Vag. Bleeding: None.  Movement: Present. Denies leaking of fluid.   The following portions of the patient's history were reviewed and updated as appropriate: allergies, current medications, past family history, past medical history, past social history, past surgical history and problem list.   Objective:   Vitals:   08/17/19 1426  BP: 116/73  Pulse: 96  Weight: 158 lb 9.6 oz (71.9 kg)    Fetal Status: Fetal Heart Rate (bpm): 142   Movement: Present  Presentation: Vertex  General:  Alert, oriented and cooperative. Patient is in no acute distress.  Skin: Skin is warm and dry. No rash noted.   Cardiovascular: Normal heart rate noted  Respiratory: Normal respiratory effort, no problems with respiration noted  Abdomen: Soft, gravid, appropriate for gestational age.  Pain/Pressure: Present     Pelvic: Cervical exam performed Dilation: Closed Effacement (%): 50 Station: -3 1 cm/50%/-3  Extremities: Normal range of motion.     Mental Status: Normal mood and affect. Normal behavior. Normal judgment and thought content.   Assessment and Plan:  Pregnancy: G2P1001 at [redacted]w[redacted]d 1. Encounter for supervision of low-risk pregnancy in third trimester - GBS negative - IOL scheduled   2. History of chlamydia TOC negative  3. Trichomonas infection TOC negative   4. Herpes simplex virus type 2 (HSV-2) infection affecting pregnancy in third trimester - On Valtrex until delivery   Term labor symptoms and general obstetric precautions including but not limited to vaginal  bleeding, contractions, leaking of fluid and fetal movement were reviewed in detail with the patient. Please refer to After Visit Summary for other counseling recommendations.   Return in about 8 days (around 08/25/2019) for LOB, In-Person, NST/BPP.  Future Appointments  Date Time Provider Department Center  08/26/2019  8:15 AM [facility] NST [facility] WOC  08/26/2019  9:15 AM Judeth Horn, NP [facility] WOC  08/31/2019  6:30 AM [facility] SCHED ROOM [facility]INDC None    Vonzella Nipple, PA-C

## 2019-08-17 NOTE — Patient Instructions (Addendum)
Fetal Movement Counts Patient Name: ________________________________________________ Patient Due Date: ____________________ What is a fetal movement count?  A fetal movement count is the number of times that you feel your baby move during a certain amount of time. This may also be called a fetal kick count. A fetal movement count is recommended for every pregnant woman. You may be asked to start counting fetal movements as early as week 28 of your pregnancy. Pay attention to when your baby is most active. You may notice your baby's sleep and wake cycles. You may also notice things that make your baby move more. You should do a fetal movement count:  When your baby is normally most active.  At the same time each day. A good time to count movements is while you are resting, after having something to eat and drink. How do I count fetal movements? 1. Find a quiet, comfortable area. Sit, or lie down on your side. 2. Write down the date, the start time and stop time, and the number of movements that you felt between those two times. Take this information with you to your health care visits. 3. Write down your start time when you feel the first movement. 4. Count kicks, flutters, swishes, rolls, and jabs. You should feel at least 10 movements. 5. You may stop counting after you have felt 10 movements, or if you have been counting for 2 hours. Write down the stop time. 6. If you do not feel 10 movements in 2 hours, contact your health care provider for further instructions. Your health care provider may want to do additional tests to assess your baby's well-being. Contact a health care provider if:  You feel fewer than 10 movements in 2 hours.  Your baby is not moving like he or she usually does. Date: ____________ Start time: ____________ Stop time: ____________ Movements: ____________ Date: ____________ Start time: ____________ Stop time: ____________ Movements: ____________ Date: ____________  Start time: ____________ Stop time: ____________ Movements: ____________ Date: ____________ Start time: ____________ Stop time: ____________ Movements: ____________ Date: ____________ Start time: ____________ Stop time: ____________ Movements: ____________ Date: ____________ Start time: ____________ Stop time: ____________ Movements: ____________ Date: ____________ Start time: ____________ Stop time: ____________ Movements: ____________ Date: ____________ Start time: ____________ Stop time: ____________ Movements: ____________ Date: ____________ Start time: ____________ Stop time: ____________ Movements: ____________ This information is not intended to replace advice given to you by your health care provider. Make sure you discuss any questions you have with your health care provider. Document Revised: 01/22/2019 Document Reviewed: 01/22/2019 Elsevier Patient Education  2020 [employer] Braxton Hicks Contractions Contractions of the uterus can occur throughout pregnancy, but they are not always a sign that you are in labor. You may have practice contractions called Braxton Hicks contractions. These false labor contractions are sometimes confused with true labor. What are Braxton Hicks contractions? Braxton Hicks contractions are tightening movements that occur in the muscles of the uterus before labor. Unlike true labor contractions, these contractions do not result in opening (dilation) and thinning of the cervix. Toward the end of pregnancy (32-34 weeks), Braxton Hicks contractions can happen more often and may become stronger. These contractions are sometimes difficult to tell apart from true labor because they can be very uncomfortable. You should not feel embarrassed if you go to the hospital with false labor. Sometimes, the only way to tell if you are in true labor is for your health care provider to look for changes in the cervix. The health care provider   will do a physical exam and may  monitor your contractions. If you are not in true labor, the exam should show that your cervix is not dilating and your water has not broken. If there are no other health problems associated with your pregnancy, it is completely safe for you to be sent home with false labor. You may continue to have Braxton Hicks contractions until you go into true labor. How to tell the difference between true labor and false labor True labor  Contractions last 30-70 seconds.  Contractions become very regular.  Discomfort is usually felt in the top of the uterus, and it spreads to the lower abdomen and low back.  Contractions do not go away with walking.  Contractions usually become more intense and increase in frequency.  The cervix dilates and gets thinner. False labor  Contractions are usually shorter and not as strong as true labor contractions.  Contractions are usually irregular.  Contractions are often felt in the front of the lower abdomen and in the groin.  Contractions may go away when you walk around or change positions while lying down.  Contractions get weaker and are shorter-lasting as time goes on.  The cervix usually does not dilate or become thin. Follow these instructions at home:   Take over-the-counter and prescription medicines only as told by your health care provider.  Keep up with your usual exercises and follow other instructions from your health care provider.  Eat and drink lightly if you think you are going into labor.  If Braxton Hicks contractions are making you uncomfortable: ? Change your position from lying down or resting to walking, or change from walking to resting. ? Sit and rest in a tub of warm water. ? Drink enough fluid to keep your urine pale yellow. Dehydration may cause these contractions. ? Do slow and deep breathing several times an hour.  Keep all follow-up prenatal visits as told by your health care provider. This is important. Contact a  health care provider if:  You have a fever.  You have continuous pain in your abdomen. Get help right away if:  Your contractions become stronger, more regular, and closer together.  You have fluid leaking or gushing from your vagina.  You pass blood-tinged mucus (bloody show).  You have bleeding from your vagina.  You have low back pain that you never had before.  You feel your baby's head pushing down and causing pelvic pressure.  Your baby is not moving inside you as much as it used to. Summary  Contractions that occur before labor are called Braxton Hicks contractions, false labor, or practice contractions.  Braxton Hicks contractions are usually shorter, weaker, farther apart, and less regular than true labor contractions. True labor contractions usually become progressively stronger and regular, and they become more frequent.  Manage discomfort from Braxton Hicks contractions by changing position, resting in a warm bath, drinking plenty of water, or practicing deep breathing. This information is not intended to replace advice given to you by your health care provider. Make sure you discuss any questions you have with your health care provider. Document Revised: 05/17/2017 Document Reviewed: 10/18/2016 Elsevier Patient Education  2020 Elsevier Inc.   Cervical Ripening: May try one or both  Red Raspberry Leaf capsules:  two 300mg or 400mg tablets with each meal, 2-3 times a day  Potential Side Effects Of Raspberry Leaf:  Most women do not experience any side effects from drinking raspberry leaf tea. However, nausea and loose   stools are possible     Evening Primrose Oil capsules: may take 1 to 3 capsules daily. May also prick one to release the oil and insert it into your vagina at night.  Some of the potential side effects:  Upset stomach  Loose stools or diarrhea  Headaches  Nausea:    

## 2019-08-20 ENCOUNTER — Telehealth (HOSPITAL_COMMUNITY): Payer: Self-pay | Admitting: *Deleted

## 2019-08-20 NOTE — Telephone Encounter (Signed)
Preadmission screen  

## 2019-08-24 ENCOUNTER — Telehealth (HOSPITAL_COMMUNITY): Payer: Self-pay | Admitting: *Deleted

## 2019-08-24 ENCOUNTER — Other Ambulatory Visit: Payer: Self-pay | Admitting: Advanced Practice Midwife

## 2019-08-24 NOTE — Telephone Encounter (Signed)
Preadmission screen  

## 2019-08-25 ENCOUNTER — Encounter (HOSPITAL_COMMUNITY): Payer: Self-pay | Admitting: *Deleted

## 2019-08-25 ENCOUNTER — Telehealth (HOSPITAL_COMMUNITY): Payer: Self-pay | Admitting: *Deleted

## 2019-08-25 NOTE — Telephone Encounter (Signed)
Preadmission screen  

## 2019-08-26 ENCOUNTER — Inpatient Hospital Stay (HOSPITAL_COMMUNITY): Payer: Medicaid Other | Admitting: Anesthesiology

## 2019-08-26 ENCOUNTER — Inpatient Hospital Stay (HOSPITAL_COMMUNITY)
Admission: AD | Admit: 2019-08-26 | Discharge: 2019-08-28 | DRG: 806 | Disposition: A | Discharge status: Home / Self Care | Payer: Medicaid Other | Attending: Family Medicine | Admitting: Family Medicine

## 2019-08-26 ENCOUNTER — Ambulatory Visit (INDEPENDENT_AMBULATORY_CARE_PROVIDER_SITE_OTHER): Payer: Medicaid Other | Admitting: Student

## 2019-08-26 ENCOUNTER — Ambulatory Visit: Payer: Self-pay

## 2019-08-26 ENCOUNTER — Encounter (HOSPITAL_COMMUNITY): Payer: Self-pay | Admitting: Obstetrics and Gynecology

## 2019-08-26 ENCOUNTER — Ambulatory Visit (INDEPENDENT_AMBULATORY_CARE_PROVIDER_SITE_OTHER): Payer: Medicaid Other | Admitting: *Deleted

## 2019-08-26 ENCOUNTER — Other Ambulatory Visit: Payer: Self-pay

## 2019-08-26 VITALS — BP 117/72 | HR 95 | Wt 161.4 lb

## 2019-08-26 DIAGNOSIS — O480 Post-term pregnancy: Secondary | ICD-10-CM | POA: Diagnosis not present

## 2019-08-26 DIAGNOSIS — O9832 Other infections with a predominantly sexual mode of transmission complicating childbirth: Secondary | ICD-10-CM | POA: Diagnosis present

## 2019-08-26 DIAGNOSIS — Z8619 Personal history of other infectious and parasitic diseases: Secondary | ICD-10-CM | POA: Diagnosis present

## 2019-08-26 DIAGNOSIS — Z3493 Encounter for supervision of normal pregnancy, unspecified, third trimester: Secondary | ICD-10-CM

## 2019-08-26 DIAGNOSIS — Z3490 Encounter for supervision of normal pregnancy, unspecified, unspecified trimester: Secondary | ICD-10-CM

## 2019-08-26 DIAGNOSIS — A6000 Herpesviral infection of urogenital system, unspecified: Secondary | ICD-10-CM | POA: Diagnosis present

## 2019-08-26 DIAGNOSIS — B009 Herpesviral infection, unspecified: Secondary | ICD-10-CM | POA: Diagnosis present

## 2019-08-26 DIAGNOSIS — Z20822 Contact with and (suspected) exposure to covid-19: Secondary | ICD-10-CM | POA: Diagnosis present

## 2019-08-26 DIAGNOSIS — A599 Trichomoniasis, unspecified: Secondary | ICD-10-CM | POA: Diagnosis present

## 2019-08-26 DIAGNOSIS — O4103X0 Oligohydramnios, third trimester, not applicable or unspecified: Secondary | ICD-10-CM | POA: Diagnosis not present

## 2019-08-26 DIAGNOSIS — Z3A40 40 weeks gestation of pregnancy: Secondary | ICD-10-CM

## 2019-08-26 DIAGNOSIS — O98519 Other viral diseases complicating pregnancy, unspecified trimester: Secondary | ICD-10-CM | POA: Diagnosis present

## 2019-08-26 DIAGNOSIS — O4103X1 Oligohydramnios, third trimester, fetus 1: Secondary | ICD-10-CM

## 2019-08-26 LAB — CBC
HCT: 31.6 % — ABNORMAL LOW (ref 36.0–46.0)
Hemoglobin: 9.5 g/dL — ABNORMAL LOW (ref 12.0–15.0)
MCH: 25.5 pg — ABNORMAL LOW (ref 26.0–34.0)
MCHC: 30.1 g/dL (ref 30.0–36.0)
MCV: 84.9 fL (ref 80.0–100.0)
Platelets: 289 10*3/uL (ref 150–400)
RBC: 3.72 MIL/uL — ABNORMAL LOW (ref 3.87–5.11)
RDW: 14.6 % (ref 11.5–15.5)
WBC: 5.9 10*3/uL (ref 4.0–10.5)
nRBC: 0 % (ref 0.0–0.2)

## 2019-08-26 LAB — ABO/RH: ABO/RH(D): A POS

## 2019-08-26 LAB — SARS CORONAVIRUS 2 (TAT 6-24 HRS): SARS Coronavirus 2: NEGATIVE

## 2019-08-26 LAB — TYPE AND SCREEN
ABO/RH(D): A POS
Antibody Screen: NEGATIVE

## 2019-08-26 MED ORDER — SODIUM CHLORIDE (PF) 0.9 % IJ SOLN
INTRAMUSCULAR | Status: DC | PRN
  Administered 2019-08-26: 12 mL/h via EPIDURAL

## 2019-08-26 MED ORDER — ACETAMINOPHEN 325 MG PO TABS: 650.0000 mg | ORAL_TABLET | ORAL | Status: DC | PRN

## 2019-08-26 MED ORDER — CEFAZOLIN SODIUM-DEXTROSE 2-4 GM/100ML-% IV SOLN
2.0000 g | Freq: Once | INTRAVENOUS | Status: AC
  Administered 2019-08-26: 2 g via INTRAVENOUS
  Filled 2019-08-26: qty 100

## 2019-08-26 MED ORDER — LACTATED RINGERS IV SOLN: 500.0000 mL | INTRAVENOUS | Status: DC | PRN

## 2019-08-26 MED ORDER — FENTANYL-BUPIVACAINE-NACL 0.5-0.125-0.9 MG/250ML-% EP SOLN
12.0000 mL/h | EPIDURAL | Status: DC | PRN
  Filled 2019-08-26: qty 250

## 2019-08-26 MED ORDER — LIDOCAINE-EPINEPHRINE (PF) 2 %-1:200000 IJ SOLN
INTRAMUSCULAR | Status: DC | PRN
  Administered 2019-08-26: 3 mL via EPIDURAL
  Administered 2019-08-26: 4 mL via EPIDURAL

## 2019-08-26 MED ORDER — DIPHENHYDRAMINE HCL 50 MG/ML IJ SOLN: 12.5000 mg | INTRAMUSCULAR | Status: DC | PRN

## 2019-08-26 MED ORDER — SOD CITRATE-CITRIC ACID 500-334 MG/5ML PO SOLN: 30.0000 mL | ORAL | Status: DC | PRN

## 2019-08-26 MED ORDER — OXYTOCIN 40 UNITS IN NORMAL SALINE INFUSION - SIMPLE MED
2.5000 [IU]/h | INTRAVENOUS | Status: DC
  Administered 2019-08-26 – 2019-08-27 (×2): 2.5 [IU]/h via INTRAVENOUS
  Filled 2019-08-26: qty 1000

## 2019-08-26 MED ORDER — PHENYLEPHRINE 40 MCG/ML (10ML) SYRINGE FOR IV PUSH (FOR BLOOD PRESSURE SUPPORT): 80.0000 ug | PREFILLED_SYRINGE | INTRAVENOUS | Status: DC | PRN

## 2019-08-26 MED ORDER — LIDOCAINE HCL (PF) 1 % IJ SOLN
30.0000 mL | INTRAMUSCULAR | Status: AC | PRN
  Administered 2019-08-26: 30 mL via SUBCUTANEOUS
  Filled 2019-08-26: qty 30

## 2019-08-26 MED ORDER — FENTANYL CITRATE (PF) 100 MCG/2ML IJ SOLN
100.0000 ug | INTRAMUSCULAR | Status: DC | PRN
  Administered 2019-08-26: 100 ug via INTRAVENOUS
  Filled 2019-08-26: qty 2

## 2019-08-26 MED ORDER — LACTATED RINGERS IV SOLN: INTRAVENOUS | Status: DC

## 2019-08-26 MED ORDER — TERBUTALINE SULFATE 1 MG/ML IJ SOLN: 0.2500 mg | Freq: Once | INTRAMUSCULAR | Status: DC | PRN

## 2019-08-26 MED ORDER — LACTATED RINGERS IV SOLN
500.0000 mL | Freq: Once | INTRAVENOUS | Status: AC
  Administered 2019-08-26: 500 mL via INTRAVENOUS

## 2019-08-26 MED ORDER — OXYTOCIN 40 UNITS IN NORMAL SALINE INFUSION - SIMPLE MED
1.0000 m[IU]/min | INTRAVENOUS | Status: DC
  Administered 2019-08-26: 18:00:00 2 m[IU]/min via INTRAVENOUS

## 2019-08-26 MED ORDER — OXYCODONE-ACETAMINOPHEN 5-325 MG PO TABS: 2.0000 | ORAL_TABLET | ORAL | Status: DC | PRN

## 2019-08-26 MED ORDER — EPHEDRINE 5 MG/ML INJ: 10.0000 mg | INTRAVENOUS | Status: DC | PRN

## 2019-08-26 MED ORDER — LIDOCAINE HCL (PF) 1 % IJ SOLN
INTRAMUSCULAR | Status: DC | PRN
  Administered 2019-08-26 (×2): 4 mL via EPIDURAL

## 2019-08-26 MED ORDER — OXYCODONE-ACETAMINOPHEN 5-325 MG PO TABS: 1.0000 | ORAL_TABLET | ORAL | Status: DC | PRN

## 2019-08-26 MED ORDER — MISOPROSTOL 50MCG HALF TABLET
ORAL_TABLET | ORAL | Status: AC
  Administered 2019-08-26: 50 ug via BUCCAL
  Filled 2019-08-26: qty 1

## 2019-08-26 MED ORDER — ONDANSETRON HCL 4 MG/2ML IJ SOLN: 4.0000 mg | Freq: Four times a day (QID) | INTRAMUSCULAR | Status: DC | PRN

## 2019-08-26 MED ORDER — MISOPROSTOL 25 MCG QUARTER TABLET
25.0000 ug | ORAL_TABLET | ORAL | Status: DC | PRN
  Filled 2019-08-26: qty 1

## 2019-08-26 MED ORDER — MISOPROSTOL 50MCG HALF TABLET: 50.0000 ug | ORAL_TABLET | ORAL | Status: DC | PRN

## 2019-08-26 MED ORDER — PHENYLEPHRINE 40 MCG/ML (10ML) SYRINGE FOR IV PUSH (FOR BLOOD PRESSURE SUPPORT)
80.0000 ug | PREFILLED_SYRINGE | INTRAVENOUS | Status: DC | PRN
  Filled 2019-08-26: qty 10

## 2019-08-26 MED ORDER — OXYTOCIN BOLUS FROM INFUSION
500.0000 mL | Freq: Once | INTRAVENOUS | Status: AC
  Administered 2019-08-26: 500 mL via INTRAVENOUS

## 2019-08-26 NOTE — Progress Notes (Signed)
IOL scheduled 3/15 in am.  Pt desires Cx exam today

## 2019-08-26 NOTE — Progress Notes (Signed)
LABOR PROGRESS NOTE  Rebekah Lloyd is a 24 y.o. G2P1001 at [redacted]w[redacted]d  admitted for IOL for post dates and borderline oligo.  Subjective: Patient doing well overall. Says she does not feel comfortable using the peanut.   Objective: BP 133/74   Pulse (!) 109   Temp 98.4 F (36.9 C) (Oral)   Resp 18   Ht 5\' 7"  (1.702 m)   Wt 73.9 kg   LMP 11/17/2018 (Exact Date)   SpO2 100%   BMI 25.53 kg/m  or  Vitals:   08/26/19 2055 08/26/19 2100 08/26/19 2130 08/26/19 2201  BP: 125/67 120/70 122/74 133/74  Pulse: 93 93 99 (!) 109  Resp: 16 16 16 18   Temp:    98.4 F (36.9 C)  TempSrc:    Oral  SpO2: 99% 99% 100%   Weight:      Height:         Dilation: 6.5 Effacement (%): 90 Cervical Position: Middle Station: 0 Presentation: Vertex Exam by:: Dr.  FHT: baseline rate 140, moderate varibility, positive acel, variable decel Toco: q2-3 min  Labs: Lab Results  Component Value Date   WBC 5.9 08/26/2019   HGB 9.5 (L) 08/26/2019   HCT 31.6 (L) 08/26/2019   MCV 84.9 08/26/2019   PLT 289 08/26/2019    Patient Active Problem List   Diagnosis Date Noted  . Post-dates pregnancy 08/26/2019  . 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 History of chlamydia 04/22/2019  . Trichomonas infection 04/22/2019  . Supervision of low-risk pregnancy 01/27/2019    Assessment / Plan: 24 y.o. G2P1001 at [redacted]w[redacted]d here for IOL for post dates and borderline oligo.  Labor: s/p FB and cytotec. Pitocin was started but then stopped due to laboring on her own. She stopped making progress so augmentation was continued. AROM ~2205. Start pitocin.  Fetal Wellbeing:  Cat 2 Pain Control:  Epidural GBS: negative Anticipated MOD:  VD  21, MD OB Resident 08/26/2019, 10:34 PM

## 2019-08-26 NOTE — H&P (Signed)
OBSTETRIC ADMISSION HISTORY AND PHYSICAL  Rebekah Lloyd is a 24 y.o. female G2P1001 with IUP at [redacted]w[redacted]d by LMP presenting for IOL for PD (due date: 08/24/2019) and oligohydramnios. She was sent form [facility] after her OB visit today. She had a sterile speculum exam today to check for pooling of amniotic fluid; negative for pooling and fern negative.  Reports fetal movement. Denies vaginal bleeding.  She received her prenatal care at [facility].  Support person in labor: Christian (FOB)  Ultrasounds . Anatomy U/S: normal . AFI 5.6 cm  BPP 8/8 from today  Prenatal History/Complications: . None - low risk on NIPS  OB BOX:  Nursing Staff Provider  Office Location  [facility] Dating   certain LMP  Language   English Anatomy US   normal [place]   Flu Vaccine Declined-05/26/2019 Genetic Screen  NIPS: low risk female AFP:  Screen negative   TDaP vaccine   05/26/2019 Hgb A1C or  GTT Early   HgbA1c is 4.9 Third trimester   75    Glucose, 1 hour 65 - 179 mg/dL 98   Glucose, 2 hour 65 - 152 mg/dL 77     Rhogam  NA   LAB RESULTS   Feeding Plan Breast Blood Type   A pos  Contraception Depo Antibody  neg  Circumcision  female  [place]  IM[facility]UNE  Pediatrician  [facility] RPR   NR  Support Person Christian(FOB) HBsAg   NEG  Prenatal Classes  HIV  NR  BTL Consent NA GBS  NEG (For PCN allergy, check sensitivities)   VBAC Consent NA Pap  normal  01/2019    Hgb Electro   Negative Horizon  BP Cuff Ordered thru [facility] 02/10/19 CF  Negative Horizon    SMA  Negative Horizon    Waterbirth  [ ]  Class [ ]  Consent [ ]  CNM visit   Past Medical History: Past Medical History:  Diagnosis Date  . Medical history non-contributory     Past Surgical History: Past Surgical History:  Procedure Laterality Date  . NO PAST SURGERIES      Obstetrical History: OB History    Gravida  2   Para  1   Term  1   Preterm      AB      Living  1     SAB      TAB      Ectopic      Multiple   0   Live Births  1           Social History: Social History   Socioeconomic History  . Marital status: Single    Spouse name: Not on file  . Number of children: Not on file  . Years of education: Not on file  . Highest education level: Not on file  Occupational History  . Not on file  Tobacco Use  . Smoking status: Never Smoker  . Smokeless tobacco: Never Used  Substance and Sexual Activity  . Alcohol use: No  . Drug use: No  . Sexual activity: Yes    Birth control/protection: None  Other Topics Concern  . Not on file  Social History Narrative  . Not on file   Social Determinants of Health   Financial Resource Strain:   . Difficulty of Paying Living Expenses: Not on file  Food Insecurity: No Food Insecurity  . Worried About in the Last Year: Never true  . Ran Out of  Food in the Last Year: Never true  Transportation Needs: No Transportation Needs  . Lack of Transportation (Medical): No  . Lack of Transportation (Non-Medical): No  Physical Activity:   . Days of Exercise per Week: Not on file  . Minutes of Exercise per Session: Not on file  Stress:   . Feeling of Stress : Not on file  Social Connections:   . Frequency of Communication with Friends and Family: Not on file  . Frequency of Social Gatherings with Friends and Family: Not on file  . Attends Religious Services: Not on file  . Active Member of Clubs or Organizations: Not on file  . Attends Archivist Meetings: Not on file  . Marital Status: Not on file    Family History: Family History  Problem Relation Age of Onset  . Colon cancer Father     Allergies: No Known Allergies  Medications Prior to Admission  Medication Sig Dispense Refill Last Dose  . acetaminophen (TYLENOL) 500 MG tablet Take 1,000 mg by mouth every 6 (six) hours as needed.     . Blood Pressure Monitoring DEVI 1 Device by Does not apply route once a week. OCD 10 ; Z34.90 1 Device 0   . docusate  sodium (COLACE) 100 MG capsule Take 1 capsule (100 mg total) by mouth 2 (two) times daily. (Patient not taking: Reported on 02/10/2019) 30 capsule 0   . prenatal vitamin w/FE, FA (PRENATAL 1 + 1) 27-1 MG TABS tablet Take 1 tablet by mouth daily at 12 noon. 30 tablet 11   . valACYclovir (VALTREX) 500 MG tablet Take 1 tablet (500 mg total) by mouth 2 (two) times daily. 90 tablet 1      Review of Systems  All systems reviewed and negative except as stated in HPI  Blood pressure 131/79, pulse 100, resp. rate 18, height 5\' 7"  (1.702 m), weight 73.9 kg, last menstrual period 11/17/2018. General appearance: alert, cooperative and no distress Lungs: no respiratory distress Heart: regular rate  Abdomen: soft, non-tender; gravid Pelvic: Gynecoid pelvis with adequate room for vaginal delivery Extremities: Homans sign is negative, no sign of DVT Presentation: cephalic Fetal monitoring: 140 bpm / moderate variability / accels present / decels absent  Uterine activity: regular every 5-6 mins   Dilation: 3 Effacement (%): 60 Station: -1 Exam by:: Sunday Corn, CNM  Prenatal labs: ABO, Rh: --/--/PENDING (03/10 1242) Antibody: PENDING (03/10 1242) Rubella: 2.85 (08/25 1617) RPR: Non Reactive (12/08 0837)  HBsAg: Negative (08/25 1617)  HIV: Non Reactive (12/08 0837)  GBS: Negative/-- (02/09 1046)  Glucola: nmL >75-98-77 Genetic screening:  normal  Prenatal Transfer Tool  Maternal Diabetes: No Genetic Screening: Normal Maternal Ultrasounds/Referrals: Normal Fetal Ultrasounds or other Referrals:  None Maternal Substance Abuse:  No Significant Maternal Medications:  None Significant Maternal Lab Results: Group B Strep negative and Other: (+) HSV on Valtrex  Results for orders placed or performed during the hospital encounter of 08/26/19 (from the past 24 hour(s))  Type and screen   Collection Time: 08/26/19 12:42 PM  Result Value Ref Range   ABO/RH(D) PENDING    Antibody Screen PENDING     Sample Expiration      08/29/2019,2359 Performed at [facility], [address], [place], [place] [address]     Patient Active Problem List   Diagnosis Date Noted  . Post-dates pregnancy 08/26/2019  . HSV-2 infection complicating pregnancy 52/84/1324  . History of chlamydia 04/22/2019  . Trichomonas infection 04/22/2019  .  Supervision of low-risk pregnancy 01/27/2019    Assessment/Plan:  CARNITA GOLOB is a 24 y.o. G2P1001 at [redacted]w[redacted]d here for IOL for PD and oligohydramnios  Labor: IOL -- pain control: planning epidural  Fetal Wellbeing: EFW 7 lbs by Leopold's. Cephalic by VE.  -- GBS (NEG) -- continuous fetal monitoring - Category 1    Postpartum Planning -- breast -- [x] Tdap (05/26/19)  declined flu vaccine  14/8/20, CNM  08/26/2019, 1:06 PM

## 2019-08-26 NOTE — Patient Instructions (Signed)

## 2019-08-26 NOTE — MAU Note (Signed)
covid swab collected, pt denies symptoms. States father recently had covid but he was quarantining since he was sick and she wasn't around him

## 2019-08-26 NOTE — Progress Notes (Signed)
   PRENATAL VISIT NOTE  Subjective:  Rebekah Lloyd is a 24 y.o. G2P1001 at [redacted]w[redacted]d being seen today for ongoing prenatal care.  She is currently monitored for the following issues for this low-risk pregnancy and has Supervision of low-risk pregnancy; History of chlamydia; Trichomonas infection; and HSV-2 infection complicating pregnancy on their problem list.  Patient reports no complaints.  Contractions: Irregular. Vag. Bleeding: None.  Movement: Present. Denies leaking of fluid.   The following portions of the patient's history were reviewed and updated as appropriate: allergies, current medications, past family history, past medical history, past social history, past surgical history and problem list.   Objective:   Vitals:   08/26/19 0827  BP: 117/72  Pulse: 95  Weight: 161 lb 6.4 oz (73.2 kg)    Fetal Status: Fetal Heart Rate (bpm): NST   Movement: Present     General:  Alert, oriented and cooperative. Patient is in no acute distress.  Skin: Skin is warm and dry. No rash noted.   Cardiovascular: Normal heart rate noted  Respiratory: Normal respiratory effort, no problems with respiration noted  Abdomen: Soft, gravid, appropriate for gestational age.  Pain/Pressure: Present     Pelvic: Cervical exam deferred. SSE performed, pooling of clear fluid. NEFG. No lesions noted.        Extremities: Normal range of motion.  Edema: None  Mental Status: Normal mood and affect. Normal behavior. Normal judgment and thought content.   Assessment and Plan:  Pregnancy: G2P1001 at [redacted]w[redacted]d 1. Encounter for supervision of low-risk pregnancy in third trimester -GBS negative -has been taking valtrex for HSV suppression. No signs of outbreak. Spec exam performed in the office, no lesions.   2. Post term pregnancy, antepartum condition or complication   3. Oligohydramnios in third trimester, single or unspecified fetus -AFI 5.6 today. Pooling on spec exam but fern negative & pt decnies LOF. D/w  Dr. Debroah Loop who recommends delivery since 40+ weeks. Dr. Ashok Pall notified.     Return for pt going to labor & delivery from here.  Future Appointments  Date Time Provider Department Center  08/29/2019  9:30 AM [facility]EN[facility] None    Judeth Horn, NP

## 2019-08-26 NOTE — Anesthesia Procedure Notes (Signed)
Epidural Patient location during procedure: OB Start time: 08/26/2019 7:02 PM End time: 08/26/2019 7:05 PM  Staffing Anesthesiologist: Kaylyn Layer, MD Performed: anesthesiologist   Preanesthetic Checklist Completed: patient identified, IV checked, risks and benefits discussed, monitors and equipment checked, pre-op evaluation and timeout performed  Epidural Patient position: sitting Prep: DuraPrep and site prepped and draped Patient monitoring: continuous pulse ox, blood pressure and heart rate Approach: midline Location: L3-L4 Injection technique: LOR air  Needle:  Needle type: Tuohy  Needle gauge: 17 G Needle length: 9 cm Catheter type: closed end flexible Catheter size: 19 Gauge Catheter at skin depth: 8 cm Test dose: negative and Other (1% lidocaine)  Assessment Events: blood not aspirated, injection not painful, no injection resistance, no paresthesia and negative IV test  Additional Notes Patient identified. Risks, benefits, and alternatives discussed with patient including but not limited to bleeding, infection, nerve damage, paralysis, failed block, incomplete pain control, headache, blood pressure changes, nausea, vomiting, reactions to medication, itching, and postpartum back pain. Confirmed with bedside nurse the patient's most recent platelet count. Confirmed with patient that they are not currently taking any anticoagulation, have any bleeding history, or any family history of bleeding disorders. Patient expressed understanding and wished to proceed. All questions were answered. Sterile technique was used throughout the entire procedure. Please see nursing notes for vital signs.   Crisp LOR on first pass. Test dose was given through epidural catheter and negative prior to continuing to dose epidural or start infusion. Warning signs of high block given to the patient including shortness of breath, tingling/numbness in hands, complete motor block, or any concerning  symptoms with instructions to call for help. Patient was given instructions on fall risk and not to get out of bed. All questions and concerns addressed with instructions to call with any issues or inadequate analgesia.  Reason for block:procedure for pain

## 2019-08-26 NOTE — Progress Notes (Signed)
Patient ID: Rebekah Lloyd, female   DOB: 1995-10-31, 24 y.o.   MRN: [id number]  Feeling uncomfortable- epidural just placed within the hour; still working on positioning to help  BP 137/79, 131/79 FHR 130s, +10x10accels, no decels Ctx q 1-2 mins with Pit at 58mu/min Cx 7/80/vtx -1  IUP@40 .2wks Active labor GBS neg  Stop Pit as she appears to be laboring mostly on her own Anticipate vag del  [name] [facility] 08/26/2019 7:50 PM

## 2019-08-26 NOTE — Discharge Summary (Addendum)
Postpartum Discharge Summary     Patient Name: Rebekah Lloyd DOB: 10/28/1995 MRN: [id number]  Date of admission: 08/26/2019 Delivering Provider: Clarnce Flock   Date of discharge: 08/28/2019  Admitting diagnosis: Post-dates pregnancy [O48.0] Intrauterine pregnancy: [redacted]w[redacted]d    Secondary diagnosis:  Active Problems:   Supervision of low-risk pregnancy   History of chlamydia   Trichomonas infection   HSV-2 infection complicating pregnancy   Post-dates pregnancy   Avulsion of umbilical cord  Additional problems: None     Discharge diagnosis: Term Pregnancy Delivered                                                                                                Post partum procedures: manual extraction of placenta  Augmentation: AROM, Cytotec and Foley Balloon  Complications: None  Hospital course:  Induction of Labor With Vaginal Delivery   24y.o. yo G2P1001 at 421w2das admitted to the hospital 08/26/2019 for induction of labor.  Indication for induction: Postdates and borderline oligo w AFI 5.6 on day of admission.  .  Patient had an uncomplicated labor course as follows: induced with foley bulb and pitocin, reached 7cm and then had AROM followed by uncomplicated NSVD. Membrane Rupture Time/Date: 10:05 PM ,08/26/2019   Intrapartum Procedures: Episiotomy: None [1]                                         Lacerations:  1st degree [2];Periurethral [8];Perineal [11]  Patient had delivery of a Viable infant.  Information for the patient's newborn:  Rebekah LloydDelivery Method: Vag-Spont    08/26/2019  Details of delivery can be found in separate delivery note.  Patient had a routine postpartum course. Patient was provided a Depo shot per her request prior to discharge. Patient is discharged home 08/28/19. Delivery time: 11:01 PM    Magnesium Sulfate received: No BMZ received: No Rhophylac:N/A MMR:N/A Transfusion:No  Physical exam  Vitals:   08/27/19 1135 08/27/19 1342 08/27/19 2151 08/28/19 0537  BP: 104/60 119/72 (!) 114/58 103/70  Pulse: 81 74 79 85  Resp: _0 Temp: 98.6 F (37 C) 98.9 F (37.2 C) 97.9 F (36.6 C) 98 F (36.7 C)  TempSrc: Oral Oral Oral Oral  SpO2:   99% 99%  Weight:      Height:       General: alert, cooperative and no distress Lochia: appropriate Uterine Fundus: firm Incision: N/A DVT Evaluation: No evidence of DVT seen on physical exam. Labs: Lab Results  Component Value Date   WBC 17.1 (H) 08/27/2019   HGB 9.5 (L) 08/27/2019   HCT 30.9 (L) 08/27/2019   MCV 83.5 08/27/2019   PLT 278 08/27/2019   No flowsheet data found. Edinburgh Score: Edinburgh Postnatal Depression Scale Screening Tool 08/27/2019  I have been able to laugh and see the funny side of things. (No Data)  I have looked forward with enjoyment to things. -  I have blamed myself unnecessarily when things  went wrong. -  I have been anxious or worried for no good reason. -  I have felt scared or panicky for no good reason. -  Things have been getting on top of me. -  I have been so unhappy that I have had difficulty sleeping. -  I have felt sad or miserable. -  I have been so unhappy that I have been crying. -  The thought of harming myself has occurred to me. [name] Postnatal Depression Scale Total -    Discharge instruction: per After Visit Summary and "Baby and Me Booklet".  After visit meds:  Allergies as of 08/28/2019   No Known Allergies      Medication List     TAKE these medications    acetaminophen 500 MG tablet Commonly known as: TYLENOL Take 1,000 mg by mouth every 6 (six) hours as needed for mild pain or headache.   Blood Pressure Monitoring Devi 1 Device by Does not apply route once a week. OCD 10 ; Z34.90   ibuprofen 600 MG tablet Commonly known as: ADVIL Take 1 tablet (600 mg total) by mouth every 6 (six) hours.   prenatal vitamin w/FE, FA 27-1 MG Tabs tablet Take 1 tablet by  mouth daily at 12 noon.   valACYclovir 500 MG tablet Commonly known as: Valtrex Take 1 tablet (500 mg total) by mouth 2 (two) times daily.        Diet: routine diet  Activity: Advance as tolerated. Pelvic rest for 6 weeks.   Outpatient follow up:6 weeks Follow up Appt: Future Appointments  Date Time Provider [place]  08/29/2019  9:30 AM [facility] None  09/28/2019  2:15 PM Jorje Guild, NP [facility]   Follow up Visit:    Please schedule this patient for Postpartum visit in: 6 weeks with the following provider: Any provider Virtual For C/S patients schedule nurse incision check in weeks 2 weeks: no Low risk pregnancy complicated by: n/a Delivery mode:  SVD Anticipated Birth Control:  Depo PP Procedures needed: none  Schedule Integrated BH visit: no   Newborn Data: Live born female  Birth Weight:  3419g APGAR: 33, 9  Newborn Delivery   Birth date/time: 08/26/2019 23:01:00 Delivery type: Vaginal, Spontaneous      Baby Feeding: Breast Disposition:home with mother   08/28/2019 Lurline Del, DO  I personally saw and evaluated the patient, performing the key elements of the service. I developed and verified the management plan that is described in the resident's/student's note, and I agree with the content with my edits above. VSS, HRR&R, Resp unlabored, Legs neg.  Nigel Berthold, CNM 08/30/2019 10:54 AM

## 2019-08-26 NOTE — Anesthesia Preprocedure Evaluation (Addendum)
Anesthesia Evaluation  Patient identified by MRN, date of birth, ID band Patient awake    Reviewed: Allergy & Precautions, Patient's Chart, lab work & pertinent test results  History of Anesthesia Complications Negative for: history of anesthetic complications  Airway Mallampati: II  TM Distance: >3 FB Neck ROM: Full    Dental no notable dental hx.    Pulmonary neg pulmonary ROS,    Pulmonary exam normal        Cardiovascular negative cardio ROS Normal cardiovascular exam     Neuro/Psych negative neurological ROS  negative psych ROS   GI/Hepatic negative GI ROS, Neg liver ROS,   Endo/Other  negative endocrine ROS  Renal/GU negative Renal ROS  negative genitourinary   Musculoskeletal negative musculoskeletal ROS (+)   Abdominal   Peds  Hematology negative hematology ROS (+)   Anesthesia Other Findings Day of surgery medications reviewed with patient.  Reproductive/Obstetrics (+) Pregnancy                             Anesthesia Physical Anesthesia Plan  ASA: II  Anesthesia Plan: Epidural   Post-op Pain Management:    Induction:   PONV Risk Score and Plan: Treatment may vary due to age or medical condition  Airway Management Planned: Natural Airway  Additional Equipment:   Intra-op Plan:   Post-operative Plan:   Informed Consent: I have reviewed the patients History and Physical, chart, labs and discussed the procedure including the risks, benefits and alternatives for the proposed anesthesia with the patient or authorized representative who has indicated his/her understanding and acceptance.       Plan Discussed with:   Anesthesia Plan Comments:         Anesthesia Quick Evaluation  

## 2019-08-26 NOTE — Progress Notes (Signed)
Rebekah Lloyd is a 24 y.o. G2P1001 at [redacted]w[redacted]d by LMP admitted for induction of labor due to Post dates. Due date 08/24/2019 and Low amniotic fluid..  Subjective:   Objective: BP 119/68   Pulse 94   Temp 98.3 F (36.8 C) (Oral)   Resp 18   Ht 5\' 7"  (1.702 m)   Wt 73.9 kg   LMP 11/17/2018 (Exact Date)   BMI 25.53 kg/m     FHT:  FHR: 145 bpm, variability: moderate,  accelerations:  Present,  decelerations:  Absent UC:   regular, every 2-3 minutes SVE:   Dilation: 5 Effacement (%): 70 Station: -1 Exam by:: [email or web address], CNM  Labs: Lab Results  Component Value Date   WBC 5.9 08/26/2019   HGB 9.5 (L) 08/26/2019   HCT 31.6 (L) 08/26/2019   MCV 84.9 08/26/2019   PLT 289 08/26/2019    Assessment / Plan: Induction of labor due to postterm and low amniotic fluid,  progressing well on pitocin  Labor: Progressing normally post Foley balloon Preeclampsia:  n/a Fetal Wellbeing:  Category I Pain Control:  Labor support without medications. Planning an epidural I/D:  n/a Anticipated MOD:  NSVD  10/26/2019, MSN, CNM 08/26/2019, 5:37 PM

## 2019-08-27 ENCOUNTER — Encounter (HOSPITAL_COMMUNITY): Payer: Self-pay | Admitting: Obstetrics and Gynecology

## 2019-08-27 LAB — CBC
HCT: 30.9 % — ABNORMAL LOW (ref 36.0–46.0)
Hemoglobin: 9.5 g/dL — ABNORMAL LOW (ref 12.0–15.0)
MCH: 25.7 pg — ABNORMAL LOW (ref 26.0–34.0)
MCHC: 30.7 g/dL (ref 30.0–36.0)
MCV: 83.5 fL (ref 80.0–100.0)
Platelets: 278 10*3/uL (ref 150–400)
RBC: 3.7 MIL/uL — ABNORMAL LOW (ref 3.87–5.11)
RDW: 14.6 % (ref 11.5–15.5)
WBC: 17.1 10*3/uL — ABNORMAL HIGH (ref 4.0–10.5)
nRBC: 0 % (ref 0.0–0.2)

## 2019-08-27 LAB — RPR: RPR Ser Ql: NONREACTIVE

## 2019-08-27 MED ORDER — METHYLERGONOVINE MALEATE 0.2 MG/ML IJ SOLN: 0.2000 mg | INTRAMUSCULAR | Status: DC | PRN

## 2019-08-27 MED ORDER — IBUPROFEN 600 MG PO TABS
600.0000 mg | ORAL_TABLET | Freq: Four times a day (QID) | ORAL | Status: DC
  Administered 2019-08-27 – 2019-08-28 (×4): 600 mg via ORAL
  Filled 2019-08-27 (×5): qty 1

## 2019-08-27 MED ORDER — ONDANSETRON HCL 4 MG/2ML IJ SOLN: 4.0000 mg | INTRAMUSCULAR | Status: DC | PRN

## 2019-08-27 MED ORDER — DIPHENHYDRAMINE HCL 25 MG PO CAPS: 25.0000 mg | ORAL_CAPSULE | Freq: Four times a day (QID) | ORAL | Status: DC | PRN

## 2019-08-27 MED ORDER — BENZOCAINE-MENTHOL 20-0.5 % EX AERO
1.0000 "application " | INHALATION_SPRAY | CUTANEOUS | Status: DC | PRN
  Filled 2019-08-27: qty 56

## 2019-08-27 MED ORDER — WITCH HAZEL-GLYCERIN EX PADS: 1.0000 "application " | MEDICATED_PAD | CUTANEOUS | Status: DC | PRN

## 2019-08-27 MED ORDER — OXYTOCIN 40 UNITS IN NORMAL SALINE INFUSION - SIMPLE MED: 2.5000 [IU]/h | INTRAVENOUS | Status: DC | PRN

## 2019-08-27 MED ORDER — COCONUT OIL OIL: 1.0000 "application " | TOPICAL_OIL | Status: DC | PRN

## 2019-08-27 MED ORDER — TETANUS-DIPHTH-ACELL PERTUSSIS 5-2.5-18.5 LF-MCG/0.5 IM SUSP: 0.5000 mL | Freq: Once | INTRAMUSCULAR | Status: DC

## 2019-08-27 MED ORDER — OXYCODONE HCL 5 MG PO TABS: 10.0000 mg | ORAL_TABLET | ORAL | Status: DC | PRN

## 2019-08-27 MED ORDER — SIMETHICONE 80 MG PO CHEW: 80.0000 mg | CHEWABLE_TABLET | ORAL | Status: DC | PRN

## 2019-08-27 MED ORDER — OXYCODONE HCL 5 MG PO TABS
5.0000 mg | ORAL_TABLET | ORAL | Status: DC | PRN
  Administered 2019-08-27: 5 mg via ORAL
  Filled 2019-08-27: qty 1

## 2019-08-27 MED ORDER — DIBUCAINE (PERIANAL) 1 % EX OINT: 1.0000 "application " | TOPICAL_OINTMENT | CUTANEOUS | Status: DC | PRN

## 2019-08-27 MED ORDER — ONDANSETRON HCL 4 MG PO TABS: 4.0000 mg | ORAL_TABLET | ORAL | Status: DC | PRN

## 2019-08-27 MED ORDER — ACETAMINOPHEN 325 MG PO TABS
650.0000 mg | ORAL_TABLET | ORAL | Status: DC | PRN
  Administered 2019-08-27: 650 mg via ORAL
  Filled 2019-08-27: qty 2

## 2019-08-27 MED ORDER — PRENATAL MULTIVITAMIN CH
1.0000 | ORAL_TABLET | Freq: Every day | ORAL | Status: DC
  Administered 2019-08-27 – 2019-08-28 (×2): 1 via ORAL
  Filled 2019-08-27 (×2): qty 1

## 2019-08-27 MED ORDER — METHYLERGONOVINE MALEATE 0.2 MG PO TABS: 0.2000 mg | ORAL_TABLET | ORAL | Status: DC | PRN

## 2019-08-27 NOTE — Lactation Note (Addendum)
This note was copied from a baby's chart. Lactation Consultation Note  Patient Name: Rebekah Lloyd [id number] Date: 08/27/2019 Reason for consult: Initial assessment;Term P1, 5 hour term female infant. Per mom, infant did not latch earlier Questionable tongue tie. Per mom, her 22 year old daughter did not latch well she stopped breastfeeding infant 2 weeks postpartum. Tools: mom was given hand pump and breast shells due to having flat nipples. Mom latched infant, on her left breast uisng the cross cradle hold, infant did not sustain latch at first, after few attempts infant breastfed for 11 minutes. Mom will wear breast shells in bra during the day and pre-pump breast prior to latching infant at breast. Mom knows to call RN or LC if she needs assistance with latching infant at breast. Mom will breastfeed infant according to hunger cues, 8 to 12 times within 24 hours and not exceed 3 hours without breastfeeding infant.  Reviewed Baby & Me book's Breastfeeding Basics.  Mom made aware of O/P services, breastfeeding support groups, community resources, and our phone # for post-discharge questions.   Maternal Data Formula Feeding for Exclusion: No Has patient been taught Hand Expression?: Yes Does the patient have breastfeeding experience prior to this delivery?: Yes  Feeding Feeding Type: Breast Fed  LATCH Score Latch: Repeated attempts needed to sustain latch, nipple held in mouth throughout feeding, stimulation needed to elicit sucking reflex.  Audible Swallowing: A few with stimulation  Type of Nipple: Flat  Comfort (Breast/Nipple): Soft / non-tender  Hold (Positioning): Assistance needed to correctly position infant at breast and maintain latch.  LATCH Score: 6  Interventions Interventions: Breast compression;Adjust position;Assisted with latch;Breast feeding basics reviewed;Skin to skin;Breast massage;Position options;Hand express;Expressed milk;Pre-pump if  needed;Shells;Hand pump;Support pillows  Lactation Tools Discussed/Used Tools: Shells;Pump Breast pump type: Manual WIC Program: No   Consult Status Consult Status: Follow-up Date: 08/27/19 Follow-up type: In-patient    Rebekah Lloyd 08/27/2019, 4:35 AM

## 2019-08-27 NOTE — Progress Notes (Signed)
Rebekah Lloyd  Post Partum Day One:S/P SVD with repair of First Degree and Left Periurethral Laceration.   Subjective: Patient up ad lib, denies syncope or dizziness. Reports consuming regular diet without issues and denies N/V. Denies issues with urination and reports bleeding is "heavy on the pad, but light when I go to the bathroom."  Patient is breastfeeding and reports having some issues with infant latch. Patient reports that infant has not been on the breast since 4am.  Desires Depo for postpartum contraception prior to hospital discharge.  Pain is being appropriately managed with use of ibuprofen.  Objective: Vitals:   08/27/19 0045 08/27/19 0114 08/27/19 0214 08/27/19 0621  BP: 127/82 138/79 126/77 118/72  Pulse: 99 94 97 (!) 102  Resp: 16 20 20 18   Temp:  98.5 F (36.9 C) 98.1 F (36.7 C) 98.1 F (36.7 C)  TempSrc:  Oral Oral Oral  SpO2:  100% 100% 100%  Weight:      Height:       Recent Labs    08/26/19 1242 08/27/19 0633  HGB 9.5* 9.5*  HCT 31.6* 30.9*    Physical Exam:  General: alert, cooperative and no distress Mood/Affect: Appropriate/Appropriate Lungs: clear to auscultation, no wheezes, rales or rhonchi, symmetric air entry.  Heart: normal rate and regular rhythm. Breast: breasts appear normal, no suspicious masses, no skin or nipple changes or axillary nodes. Abdomen:  + bowel sounds, Soft, Nontender Uterine Fundus: firm at umbilicus Lochia: appropriate Laceration: Not assessed Skin: Warm, Dry DVT Evaluation: No significant calf/ankle edema.  Assessment S/P Vaginal Delivery-Day 1 Normal [employer] BreastFeeding  Plan: -Encouraged to place infant on breast now.  Discussed how decreased feedings could lower blood sugar and cause infant to be more lethargic.  -Will notify nurse of need for lactation assistance. -Plan to discharge tomorrow with depo prior -Continue other care as ordered  10/27/19, MSN, CNM 08/27/2019, 11:12 AM

## 2019-08-27 NOTE — Anesthesia Postprocedure Evaluation (Signed)
Anesthesia Post Note  Patient: Rebekah Lloyd  Procedure(s) Performed: AN AD HOC LABOR EPIDURAL     Patient location during evaluation: Mother Baby Anesthesia Type: Epidural Level of consciousness: awake, awake and alert and oriented Pain management: pain level controlled Vital Signs Assessment: post-procedure vital signs reviewed and stable Respiratory status: spontaneous breathing, nonlabored ventilation and respiratory function stable Cardiovascular status: stable Postop Assessment: no headache, patient able to bend at knees, no apparent nausea or vomiting, adequate PO intake, able to ambulate and no backache Anesthetic complications: no    Last Vitals:  Vitals:   08/27/19 0214 08/27/19 0621  BP: 126/77 118/72  Pulse: 97 (!) 102  Resp: 20 18  Temp: 36.7 C 36.7 C  SpO2: 100% 100%    Last Pain:  Vitals:   08/27/19 0621  TempSrc: Oral  PainSc: 0-No pain   Pain Goal:                   Walton Digilio

## 2019-08-28 MED ORDER — MEDROXYPROGESTERONE ACETATE 150 MG/ML IM SUSP
150.0000 mg | Freq: Once | INTRAMUSCULAR | Status: AC
  Administered 2019-08-28: 150 mg via INTRAMUSCULAR
  Filled 2019-08-28: qty 1

## 2019-08-28 MED ORDER — IBUPROFEN 600 MG PO TABS: 600.0000 mg | ORAL_TABLET | Freq: Four times a day (QID) | ORAL | 0 refills | Status: DC

## 2019-08-28 NOTE — Discharge Instructions (Signed)

## 2019-08-28 NOTE — Lactation Note (Signed)
This note was copied from a baby's chart. Lactation Consultation Note  Patient Name: Girl Farron Lafond [id number] Date: 08/28/2019 Reason for consult: Follow-up assessment;Term;1st time breastfeeding;Primapara  0811-0830 F/U visit with P1 mom, baby is now 55 hours old with 4% wt loss, anticipated discharge today.  LC entered room to find mom in bed with baby STS, baby cuing for feeding. Mom states baby latches well, denies any pain or discomfort with latch. Mom states "I feel good about her latches." Mom describes baby not staying latched and becoming frustrated. Mom states she offered the baby donor milk through the night and baby much more satisfied afterwards. Mom states she wants to avoid formula if possible.  Praised mom for efforts to maintain exclusive breastfeeding/breastmilk. Mom in great spirits and does not appear overwhelmed with breastfeeding. Asked mom to demonstrate her latch as baby actively suckling on her hands when LC entered room. Mom independently latched baby to right breast in football hold position. Mom with excellent technique and baby with textbook latch but did not maintain latch after a few suckles. Mom with erect nipples at rest and rounded/healthy in appearance following disruption of latch. Reviewed with mom that baby displaying cues for desire for more volume now which is normal term newborn behaviors. Mom requests additional donor breastmilk at this time. Asked mom about her plan for home as she will not be provided donor milk at discharge. Mom states she will offer formula at home if absolutely necessary. Mom states MB RN demonstrated paced bottle feeding during the night. Reinforced teaching to aid in transition to exclusive breastfeeding once milk comes to volume.  Encouraged hand expression prior to latching. Demonstrated correct positioning of fingers on breast. With mom's permission, hand expression performed by LC on both breasts with no drops/glistening  elicited.  Mom states she has an [employer] DEBP at home. Mom not currently enrolled in [facility] but contact info provided by previous LC with instructions to enroll. Strongly encouraged mom to F/U with [facility] for breastfeeding support and also assistance in obtaining better DEBP.  Reviewed expected I&O with exclusive breastfeeding in the early days. Reviewed prevention of engorgement and treatment with ice not heat. Encouraged consistent breast stimulation to initiate lactogenesis. Reviewed lactation brochure with phone number, support group information and OP lactation services.  Mom requests donor milk at this time. Donor milk fridge empty at this time, mom notified. MB RN notified of mom's request @ [phone].   Maternal Data Has patient been taught Hand Expression?: Yes Does the patient have breastfeeding experience prior to this delivery?: No  Feeding Feeding Type: Breast Fed  LATCH Score Latch: Grasps breast easily, tongue down, lips flanged, rhythmical sucking.  Audible Swallowing: None  Type of Nipple: Everted at rest and after stimulation  Comfort (Breast/Nipple): Soft / non-tender  Hold (Positioning): No assistance needed to correctly position infant at breast.  LATCH Score: 8  Interventions Interventions: Skin to skin;Breast massage;Hand express;Pre-pump if needed;Expressed milk;Position options;DEBP  Lactation Tools Discussed/Used WIC Program: No   Consult Status Consult Status: Complete    Virgia Land 08/28/2019, 8:37 AM

## 2019-08-29 ENCOUNTER — Other Ambulatory Visit (HOSPITAL_COMMUNITY): Payer: Medicaid Other

## 2019-08-31 ENCOUNTER — Inpatient Hospital Stay (HOSPITAL_COMMUNITY): Payer: Medicaid Other

## 2019-09-21 DIAGNOSIS — R0602 Shortness of breath: Secondary | ICD-10-CM | POA: Diagnosis not present

## 2019-09-28 ENCOUNTER — Telehealth (INDEPENDENT_AMBULATORY_CARE_PROVIDER_SITE_OTHER): Payer: Medicaid Other | Admitting: Student

## 2019-09-28 ENCOUNTER — Encounter: Payer: Self-pay | Admitting: Student

## 2019-09-28 NOTE — Progress Notes (Signed)
     I connected with@ on 09/28/19 at  2:15 PM EDT by: mychart and verified that I am speaking with the correct person using two identifiers.  Patient is located at home and provider is located at [facility].     The purpose of this virtual visit is to provide medical care while limiting exposure to the novel coronavirus. I discussed the limitations, risks, security and privacy concerns of performing an evaluation and management service by mychart and the availability of in person appointments. I also discussed with the patient that there may be a patient responsible charge related to this service. By engaging in this virtual visit, you consent to the provision of healthcare.  Additionally, you authorize for your insurance to be billed for the services provided during this visit.  The patient expressed understanding and agreed to proceed.  The following staff members participated in the virtual visit:  Corinda Gubler, CMA  Post Partum Visit Note Subjective:   Rebekah Lloyd is a 24 y.o. [phone] female being evaluated for postpartum followup.  She is 4 weeks postpartum following a normal spontaneous vaginal delivery at  40/2 gestational weeks.  I have fully reviewed the prenatal and intrapartum course; pregnancy complicated by HSV.  Postpartum course has been normal. Baby is doing well. Baby is feeding by both breast and bottle - [employer] Good Start. Bleeding no bleeding. Bowel function is normal. Bladder function is normal. Patient is not sexually active. Contraception method is Depo-Provera injections. Postpartum depression screening: negative.  The following portions of the patient's history were reviewed and updated as appropriate: allergies, current medications, past family history, past medical history, past social history, past surgical history and problem list.  Review of Systems Pertinent items are noted in HPI.   Objective:   Vitals:   09/28/19 1433  BP: 121/60  Pulse: 90    Self-Obtained       Assessment:    Normal postpartum exam.  Plan:   1. Encounter for routine postpartum follow-up -doing well -received depo in hospital. Will return in 2 months for next injection -pap smear up to date    5 minutes of non-face-to-face time spent with the patient    Judeth Horn, NP [facility] [employer], [facility]

## 2019-09-28 NOTE — Patient Instructions (Signed)
Health Maintenance, Female Adopting a healthy lifestyle and getting preventive care are important in promoting health and wellness. Ask your health care provider about:  The right schedule for you to have regular tests and exams.  Things you can do on your own to prevent diseases and keep yourself healthy. What should I know about diet, weight, and exercise? Eat a healthy diet   Eat a diet that includes plenty of vegetables, fruits, low-fat dairy products, and lean protein.  Do not eat a lot of foods that are high in solid fats, added sugars, or sodium. Maintain a healthy weight Body mass index (BMI) is used to identify weight problems. It estimates body fat based on height and weight. Your health care provider can help determine your BMI and help you achieve or maintain a healthy weight. Get regular exercise Get regular exercise. This is one of the most important things you can do for your health. Most adults should:  Exercise for at least 150 minutes each week. The exercise should increase your heart rate and make you sweat (moderate-intensity exercise).  Do strengthening exercises at least twice a week. This is in addition to the moderate-intensity exercise.  Spend less time sitting. Even light physical activity can be beneficial. Watch cholesterol and blood lipids Have your blood tested for lipids and cholesterol at 22 years of age, then have this test every 5 years. Have your cholesterol levels checked more often if:  Your lipid or cholesterol levels are high.  You are older than 22 years of age.  You are at high risk for heart disease. What should I know about cancer screening? Depending on your health history and family history, you may need to have cancer screening at various ages. This may include screening for:  Breast cancer.  Cervical cancer.  Colorectal cancer.  Skin cancer.  Lung cancer. What should I know about heart disease, diabetes, and high blood  pressure? Blood pressure and heart disease  High blood pressure causes heart disease and increases the risk of stroke. This is more likely to develop in people who have high blood pressure readings, are of African descent, or are overweight.  Have your blood pressure checked: ? Every 3-5 years if you are 18-39 years of age. ? Every year if you are 40 years old or older. Diabetes Have regular diabetes screenings. This checks your fasting blood sugar level. Have the screening done:  Once every three years after age 40 if you are at a normal weight and have a low risk for diabetes.  More often and at a younger age if you are overweight or have a high risk for diabetes. What should I know about preventing infection? Hepatitis B If you have a higher risk for hepatitis B, you should be screened for this virus. Talk with your health care provider to find out if you are at risk for hepatitis B infection. Hepatitis C Testing is recommended for:  Everyone born from 1945 through 1965.  Anyone with known risk factors for hepatitis C. Sexually transmitted infections (STIs)  Get screened for STIs, including gonorrhea and chlamydia, if: ? You are sexually active and are younger than 22 years of age. ? You are older than 22 years of age and your health care provider tells you that you are at risk for this type of infection. ? Your sexual activity has changed since you were last screened, and you are at increased risk for chlamydia or gonorrhea. Ask your health care provider if   you are at risk.  Ask your health care provider about whether you are at high risk for HIV. Your health care provider may recommend a prescription medicine to help prevent HIV infection. If you choose to take medicine to prevent HIV, you should first get tested for HIV. You should then be tested every 3 months for as long as you are taking the medicine. Pregnancy  If you are about to stop having your period (premenopausal) and  you may become pregnant, seek counseling before you get pregnant.  Take 400 to 800 micrograms (mcg) of folic acid every day if you become pregnant.  Ask for birth control (contraception) if you want to prevent pregnancy. Osteoporosis and menopause Osteoporosis is a disease in which the bones lose minerals and strength with aging. This can result in bone fractures. If you are 65 years old or older, or if you are at risk for osteoporosis and fractures, ask your health care provider if you should:  Be screened for bone loss.  Take a calcium or vitamin D supplement to lower your risk of fractures.  Be given hormone replacement therapy (HRT) to treat symptoms of menopause. Follow these instructions at home: Lifestyle  Do not use any products that contain nicotine or tobacco, such as cigarettes, e-cigarettes, and chewing tobacco. If you need help quitting, ask your health care provider.  Do not use street drugs.  Do not share needles.  Ask your health care provider for help if you need support or information about quitting drugs. Alcohol use  Do not drink alcohol if: ? Your health care provider tells you not to drink. ? You are pregnant, may be pregnant, or are planning to become pregnant.  If you drink alcohol: ? Limit how much you use to 0-1 drink a day. ? Limit intake if you are breastfeeding.  Be aware of how much alcohol is in your drink. In the [place] one drink equals one 12 oz bottle of beer (355 mL), one 5 oz glass of wine (148 mL), or one 1 oz glass of hard liquor (44 mL). General instructions  Schedule regular health, dental, and eye exams.  Stay current with your vaccines.  Tell your health care provider if: ? You often feel depressed. ? You have ever been abused or do not feel safe at home. Summary  Adopting a healthy lifestyle and getting preventive care are important in promoting health and wellness.  Follow your health care provider's instructions about healthy  diet, exercising, and getting tested or screened for diseases.  Follow your health care provider's instructions on monitoring your cholesterol and blood pressure. This information is not intended to replace advice given to you by your health care provider. Make sure you discuss any questions you have with your health care provider. Document Revised: 05/28/2018 Document Reviewed: 05/28/2018 Elsevier Patient Education  2020 Elsevier Inc.  

## 2019-11-27 ENCOUNTER — Ambulatory Visit: Payer: Medicaid Other

## 2019-12-09 ENCOUNTER — Other Ambulatory Visit: Payer: Self-pay

## 2019-12-09 ENCOUNTER — Ambulatory Visit (INDEPENDENT_AMBULATORY_CARE_PROVIDER_SITE_OTHER): Payer: Medicaid Other | Admitting: Lactation Services

## 2019-12-09 VITALS — BP 112/58 | Ht 67.0 in | Wt 154.0 lb

## 2019-12-09 DIAGNOSIS — Z3042 Encounter for surveillance of injectable contraceptive: Secondary | ICD-10-CM

## 2019-12-09 MED ORDER — MEDROXYPROGESTERONE ACETATE 150 MG/ML IM SUSP
150.0000 mg | Freq: Once | INTRAMUSCULAR | Status: AC
  Administered 2019-12-09: 150 mg via INTRAMUSCULAR

## 2019-12-09 NOTE — Progress Notes (Signed)
[name] here for Depo-Provera  Injection.  Injection administered without complication. Patient will return in 3 months for next injection. Patient tolerated well. Patient received Depo on 3/12. She was due between 5/28 and 6/11. She is less than 15 week from last injection. Injection given per protocol. She has had unprotected sex within the last 2 weeks.   Ed Blalock, RN 12/09/2019  10:17 AM

## 2019-12-12 NOTE — Progress Notes (Signed)
I have reviewed the chart and agree with nursing staff's documentation of this patient's encounter.  Garrison Michie, CNM 12/12/2019 9:34 AM    

## 2020-02-24 ENCOUNTER — Ambulatory Visit: Payer: Medicaid Other

## 2020-02-26 ENCOUNTER — Ambulatory Visit: Payer: Medicaid Other

## 2020-04-05 DIAGNOSIS — Z20822 Contact with and (suspected) exposure to covid-19: Secondary | ICD-10-CM | POA: Diagnosis not present

## 2020-06-18 NOTE — L&D Delivery Note (Addendum)
LABOR COURSE Rebekah Lloyd is a 25 y.o. G3P2002 at [redacted]w[redacted]d admitted for SOL. She received an epidural. AROM was performed. She progressed rapidly to completion without complication.  Delivery Note Called to room and patient was complete and pushing. Head delivered spontaneously. No nuchal cord present. Shoulder and body delivered in usual fashion. At  0353 a viable and healthy female was delivered via Vaginal, Spontaneous (Presentation: vertex; OP).  Infant with spontaneous cry, placed on mother's abdomen, dried and stimulated. Cord clamped x 2 after 1-minute delay, and cut by FOB. Cord blood drawn. Placenta delivered spontaneously with gentle cord traction. Appears intact. Fundus firm with massage and Pitocin. Methergine 0.2 mg IM administered for bright red bleeding and two large clots noted after delivery of placenta and clots were manually cleared out of the lower uterine segment.  Bleeding decreased and fundus was firm.  After clots were removed a post placental liletta IUD was placed at the fundus manually and the strings were cut at the introitus.    Labia, perineum, vagina, and cervix inspected with no lacerations requiring repair.    APGAR: 9, 9; weight pending.   Cord: 3VC with the following complications:none.   Cord pH: n/a  Anesthesia:  epidural Episiotomy: None Lacerations: None Suture Repair:  n/a Est. Blood Loss (mL): 200  Mom to postpartum.  Baby to Couplet care / Skin to Skin.  Reeves Forth, MD 04/27/21 4:23 AM     GME ATTESTATION:  I saw and evaluated the patient. I agree with the findings and the plan of care as documented in the resident's note and have made all necessary edits.  Warner Mccreedy, MD, MPH OB Fellow, Faculty Practice [facility], Center for [facility] 04/27/2021 4:53 AM

## 2020-08-23 ENCOUNTER — Other Ambulatory Visit: Payer: Self-pay

## 2020-08-23 ENCOUNTER — Ambulatory Visit (INDEPENDENT_AMBULATORY_CARE_PROVIDER_SITE_OTHER): Payer: Medicaid Other | Admitting: Lactation Services

## 2020-08-23 DIAGNOSIS — Z3201 Encounter for pregnancy test, result positive: Secondary | ICD-10-CM

## 2020-08-23 LAB — POCT PREGNANCY, URINE: Preg Test, Ur: POSITIVE — AB

## 2020-08-23 NOTE — Progress Notes (Addendum)
Patient with drop off pregnancy test. UPT positive. Patient called with results.   Patient is taking PNV.   Patient reports her LMP was February 4. Patient is 4 weeks 4 days with EDD of 04/28/2021.   Patient was informed if bleeding like a period or severe abdominal pain to go to MAU at [facility] at [facility] if needed.   Patient reports some mild cramping.   Patient reports she is not experiencing nausea at this time. Patient with no questions or concerns at this time. Patient to call the office as needed.   Message to front office to call patient to schedule for [facility]     Chart reviewed for nurse visit. Agree with plan of care.   Currie Paris, NP 08/23/2020 5:45 PM

## 2020-09-13 DIAGNOSIS — J45909 Unspecified asthma, uncomplicated: Secondary | ICD-10-CM | POA: Diagnosis not present

## 2020-09-19 ENCOUNTER — Telehealth: Payer: Self-pay | Admitting: *Deleted

## 2020-09-19 DIAGNOSIS — R059 Cough, unspecified: Secondary | ICD-10-CM | POA: Diagnosis not present

## 2020-09-19 DIAGNOSIS — J309 Allergic rhinitis, unspecified: Secondary | ICD-10-CM | POA: Diagnosis not present

## 2020-09-19 DIAGNOSIS — H1045 Other chronic allergic conjunctivitis: Secondary | ICD-10-CM | POA: Diagnosis not present

## 2020-09-19 NOTE — Telephone Encounter (Addendum)
Pt called office with reports of trouble breathing for over one year. She is currently [redacted]w[redacted]d pregnant with EDD 04/28/21 by LMP 07/22/20. Pt has initial prenatal visit scheduled in office on 10/11/20. She states that she went to [facility] office for appt today because she had been previously told during ED or Urgent Care visit that she might have asthma. She was told @ the [place] office that they could not see her because she is pregnant. Pt was tearful while we spoke. She denied severe shortness of breath @ present and stated that she has an inhaler which she uses that helps. As we talked, I was able to comfort pt and assure her that her concerns can be addressed further during scheduled visit on 4/26. I advised pt that she should continue to use the inhaler prn as prescribed. She may also take Zyrtec - one tablet daily which may help her symptoms. I instructed her to call back if her symptoms change or if she has additional questions. She voiced understanding.

## 2020-10-10 ENCOUNTER — Telehealth: Payer: Self-pay | Admitting: Lactation Services

## 2020-10-10 NOTE — Telephone Encounter (Signed)
Returned patients call. She did not answer. LM that she has an appointment in the office Tuesday 4/26 at 1:55 pm. Advised to call the office to call with any questions or concerns as needed.

## 2020-10-10 NOTE — Telephone Encounter (Signed)
Patient called and LM on nurse voicemail that she missed a call from the office and was returning call.

## 2020-10-11 ENCOUNTER — Other Ambulatory Visit (HOSPITAL_COMMUNITY)
Admission: RE | Admit: 2020-10-11 | Discharge: 2020-10-11 | Disposition: A | Discharge status: Home / Self Care | Payer: Medicaid Other | Source: Ambulatory Visit | Attending: Advanced Practice Midwife | Admitting: Advanced Practice Midwife

## 2020-10-11 ENCOUNTER — Encounter: Payer: Self-pay | Admitting: Advanced Practice Midwife

## 2020-10-11 ENCOUNTER — Other Ambulatory Visit: Payer: Self-pay

## 2020-10-11 ENCOUNTER — Ambulatory Visit (INDEPENDENT_AMBULATORY_CARE_PROVIDER_SITE_OTHER): Payer: Medicaid Other | Admitting: Advanced Practice Midwife

## 2020-10-11 VITALS — BP 125/81 | HR 78 | Wt 142.6 lb

## 2020-10-11 DIAGNOSIS — B009 Herpesviral infection, unspecified: Secondary | ICD-10-CM

## 2020-10-11 DIAGNOSIS — J45909 Unspecified asthma, uncomplicated: Secondary | ICD-10-CM

## 2020-10-11 DIAGNOSIS — Z3490 Encounter for supervision of normal pregnancy, unspecified, unspecified trimester: Secondary | ICD-10-CM | POA: Insufficient documentation

## 2020-10-11 DIAGNOSIS — O99511 Diseases of the respiratory system complicating pregnancy, first trimester: Secondary | ICD-10-CM

## 2020-10-11 DIAGNOSIS — Z3481 Encounter for supervision of other normal pregnancy, first trimester: Secondary | ICD-10-CM | POA: Diagnosis not present

## 2020-10-11 DIAGNOSIS — Z3A11 11 weeks gestation of pregnancy: Secondary | ICD-10-CM

## 2020-10-11 DIAGNOSIS — O98511 Other viral diseases complicating pregnancy, first trimester: Secondary | ICD-10-CM

## 2020-10-11 LAB — POCT URINALYSIS DIP (DEVICE)
Bilirubin Urine: NEGATIVE
Glucose, UA: NEGATIVE mg/dL
Hgb urine dipstick: NEGATIVE
Ketones, ur: NEGATIVE mg/dL
Leukocytes,Ua: NEGATIVE
Nitrite: NEGATIVE
Protein, ur: NEGATIVE mg/dL
Specific Gravity, Urine: >=1.03 (ref 1.005–1.030)
Urobilinogen, UA: 0.2 mg/dL (ref 0.0–1.0)
pH: 7 (ref 5.0–8.0)

## 2020-10-11 MED ORDER — CETIRIZINE HCL 10 MG PO TABS: 10.0000 mg | ORAL_TABLET | Freq: Every day | ORAL | 0 refills | Status: DC

## 2020-10-11 MED ORDER — FLUTICASONE PROPIONATE HFA 110 MCG/ACT IN AERO: 1.0000 | INHALATION_SPRAY | Freq: Every day | RESPIRATORY_TRACT | 12 refills | Status: DC | PRN

## 2020-10-11 MED ORDER — BLOOD PRESSURE KIT DEVI: 1.0000 | Freq: Once | 0 refills | Status: AC

## 2020-10-11 NOTE — Patient Instructions (Signed)

## 2020-10-11 NOTE — Progress Notes (Signed)
History:   Rebekah Lloyd is a 25 y.o. G3P2002 at 62w4dby LMP being seen today for her first obstetrical visit.  Her obstetrical history is significant for STI in pregnancy, term svd x 2, HSV Patient does intend to breast feed. Pregnancy history fully reviewed.  Patient reports no complaints.      HISTORY: OB History  Gravida Para Term Preterm AB Living  '3 2 2 ' 0 0 2  SAB IAB Ectopic Multiple Live Births  0 0 0 0 2    # Outcome Date GA Lbr Len/2nd Weight Sex Delivery Anes PTL Lv  3 Current           2 Term 08/26/19 462w2d9:26 / 00:11 7 lb 8.6 oz (3.419 kg) F Vag-Spont EPI  LIV     Name: Rebekah Lloyd, Rebekah Lloyd   Apgar1: 8  Apgar5: 9  1 Term 11/20/16 4060w3d:11 / 00:32 6 lb 15.5 oz (3.16 kg) F Vag-Spont EPI  LIV     Birth Comments: no complications     Name: Rebekah Lloyd, Rebekah Lloyd  Apgar1: 9  Apgar5: 9    Last pap smear was done 01/2019 and was normal  Past Medical History:  Diagnosis Date  . Medical history non-contributory    Past Surgical History:  Procedure Laterality Date  . NO PAST SURGERIES     Family History  Problem Relation Age of Onset  . Colon cancer Father    Social History   Tobacco Use  . Smoking status: Never Smoker  . Smokeless tobacco: Never Used  Vaping Use  . Vaping Use: Never used  Substance Use Topics  . Alcohol use: No  . Drug use: Not Currently    Types: Marijuana   No Known Allergies Current Outpatient Medications on File Prior to Visit  Medication Sig Dispense Refill  . prenatal vitamin w/FE, FA (PRENATAL 1 + 1) 27-1 MG TABS tablet Take 1 tablet by mouth daily at 12 noon. 30 tablet 11  . Blood Pressure Monitoring DEVI 1 Device by Does not apply route once a week. OCD 10 ; Z34.90 (Patient not taking: Reported on 10/11/2020) 1 Device 0  . valACYclovir (VALTREX) 500 MG tablet Take 1 tablet (500 mg total) by mouth 2 (two) times daily. (Patient not taking: Reported on 10/11/2020) 90 tablet 1   No current facility-administered  medications on file prior to visit.    Review of Systems Pertinent items noted in HPI and remainder of comprehensive ROS otherwise negative. Physical Exam:   Vitals:   10/11/20 1401  BP: 125/81  Pulse: 78  Weight: 142 lb 9.6 oz (64.7 kg)     Uterus:     Pelvic Exam: Perineum: no hemorrhoids, normal perineum   Vulva: normal external genitalia, no lesions   Vagina:  normal mucosa, normal discharge   Cervix: no lesions and normal, pap smear done.    Adnexa: normal adnexa and no mass, fullness, tenderness   Bony Pelvis: average  System: General: well-developed, well-nourished female in no acute distress   Breasts:  normal appearance, no masses or tenderness bilaterally   Skin: normal coloration and turgor, no rashes   Neurologic: oriented, normal, negative, normal mood   Extremities: normal strength, tone, and muscle mass, ROM of all joints is normal   HEENT PERRLA, extraocular movement intact and sclera clear, anicteric   Mouth/Teeth mucous membranes moist, pharynx normal without lesions and dental hygiene good   Neck supple and no masses   Cardiovascular:  regular rate and rhythm   Respiratory:  no respiratory distress, normal breath sounds   Abdomen: soft, non-tender; bowel sounds normal; no masses,  no organomegaly  Bedside Ultrasound for FHR check: Patient informed that the ultrasound is considered a limited obstetric ultrasound and is not intended to be a complete ultrasound exam.  Patient also informed that the ultrasound is not being completed with the intent of assessing for fetal or placental anomalies or any pelvic abnormalities.  Explained that the purpose of today's ultrasound is to assess for fetal heart rate.  Patient acknowledges the purpose of the exam and the limitations of the study.     Assessment:    Pregnancy: [id number] Patient Active Problem List   Diagnosis Date Noted  . Supervision of low-risk pregnancy, unspecified trimester 10/11/2020  . Avulsion of  umbilical cord 24/26/8341  . HSV-2 infection complicating pregnancy 96/22/2979  . History of chlamydia 04/22/2019  . Trichomonas infection 04/22/2019       Plan:    1. Encounter for supervision of other normal pregnancy in first trimester - Welcome back, had second baby with [place] !! - CHL AMB BABYSCRIPTS SCHEDULE OPTIMIZATION - Culture, OB Urine - Genetic Screening - GC/Chlamydia probe amp ([place])not at [facility] - CBC/D/Plt+RPR+Rh+ABO+Rub Ab... - Blood Pressure Monitoring (BLOOD PRESSURE KIT) DEVI; 1 Device by Does not apply route once for 1 dose.  Dispense: 1 each; Refill: 0 - POCT urinalysis dip (device)  2. [redacted] weeks gestation of pregnancy   3. Asthma affecting pregnancy in first trimester - Discussed with Dr. Dione Plover, regimen as below - fluticasone (FLOVENT HFA) 110 MCG/ACT inhaler; Inhale 1 puff into the lungs daily as needed.  Dispense: 1 each; Refill: 12 - cetirizine (ZYRTEC ALLERGY) 10 MG tablet; Take 1 tablet (10 mg total) by mouth daily.  Dispense: 30 tablet; Refill: 0  4. 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 Continue prenatal vitamins. Genetic Screening discussed, First trimester screen, Quad screen and NIPS: ordered. Ultrasound discussed; fetal anatomic survey: ordered. Problem list reviewed and updated. The nature of [place] with multiple MDs and other Advanced Practice Providers was explained to patient; also emphasized that residents, students are part of our team. Routine obstetric precautions reviewed.    Mallie Snooks, MSN, CNM Certified Nurse Midwife, [facility] for [employer], [place] [facility] 10/11/20 4:36 PM

## 2020-10-12 ENCOUNTER — Encounter: Payer: Self-pay | Admitting: General Practice

## 2020-10-12 LAB — CBC/D/PLT+RPR+RH+ABO+RUB AB...
Antibody Screen: NEGATIVE
Basophils Absolute: 0.1 10*3/uL (ref 0.0–0.2)
Basos: 1 %
EOS (ABSOLUTE): 0.3 10*3/uL (ref 0.0–0.4)
Eos: 5 %
HCV Ab: 0.2 s/co ratio (ref 0.0–0.9)
HIV Screen 4th Generation wRfx: NONREACTIVE
Hematocrit: 37.8 % (ref 34.0–46.6)
Hemoglobin: 12.6 g/dL (ref 11.1–15.9)
Hepatitis B Surface Ag: NEGATIVE
Immature Grans (Abs): 0 10*3/uL (ref 0.0–0.1)
Immature Granulocytes: 0 %
Lymphocytes Absolute: 2.3 10*3/uL (ref 0.7–3.1)
Lymphs: 42 %
MCH: 28.3 pg (ref 26.6–33.0)
MCHC: 33.3 g/dL (ref 31.5–35.7)
MCV: 85 fL (ref 79–97)
Monocytes Absolute: 0.4 10*3/uL (ref 0.1–0.9)
Monocytes: 7 %
Neutrophils Absolute: 2.4 10*3/uL (ref 1.4–7.0)
Neutrophils: 45 %
Platelets: 290 10*3/uL (ref 150–450)
RBC: 4.46 x10E6/uL (ref 3.77–5.28)
RDW: 15.1 % (ref 11.7–15.4)
RPR Ser Ql: NONREACTIVE
Rh Factor: POSITIVE
Rubella Antibodies, IGG: 3.14 index (ref >0.99)
WBC: 5.3 10*3/uL (ref 3.4–10.8)

## 2020-10-12 LAB — GC/CHLAMYDIA PROBE AMP (~~LOC~~) NOT AT ARMC
Chlamydia: NEGATIVE
Comment: NEGATIVE
Comment: NORMAL
Neisseria Gonorrhea: NEGATIVE

## 2020-10-12 LAB — HCV INTERPRETATION

## 2020-10-13 LAB — CULTURE, OB URINE

## 2020-10-13 LAB — URINE CULTURE, OB REFLEX

## 2020-11-10 ENCOUNTER — Encounter: Payer: Self-pay | Admitting: Obstetrics and Gynecology

## 2020-11-10 ENCOUNTER — Telehealth (INDEPENDENT_AMBULATORY_CARE_PROVIDER_SITE_OTHER): Payer: Medicaid Other | Admitting: Obstetrics and Gynecology

## 2020-11-10 DIAGNOSIS — O99512 Diseases of the respiratory system complicating pregnancy, second trimester: Secondary | ICD-10-CM

## 2020-11-10 DIAGNOSIS — B009 Herpesviral infection, unspecified: Secondary | ICD-10-CM

## 2020-11-10 DIAGNOSIS — O98512 Other viral diseases complicating pregnancy, second trimester: Secondary | ICD-10-CM

## 2020-11-10 DIAGNOSIS — J45909 Unspecified asthma, uncomplicated: Secondary | ICD-10-CM | POA: Insufficient documentation

## 2020-11-10 DIAGNOSIS — Z3A15 15 weeks gestation of pregnancy: Secondary | ICD-10-CM

## 2020-11-10 DIAGNOSIS — Z3492 Encounter for supervision of normal pregnancy, unspecified, second trimester: Secondary | ICD-10-CM

## 2020-11-10 DIAGNOSIS — Z3490 Encounter for supervision of normal pregnancy, unspecified, unspecified trimester: Secondary | ICD-10-CM

## 2020-11-10 DIAGNOSIS — J4520 Mild intermittent asthma, uncomplicated: Secondary | ICD-10-CM | POA: Insufficient documentation

## 2020-11-10 NOTE — Progress Notes (Signed)
   OBSTETRICS PRENATAL VIRTUAL VISIT ENCOUNTER NOTE  Provider location: Center for [facility] at [facility]   Patient location: Home  I connected with Rebekah Lloyd on 11/10/20 at 10:15 AM EDT by MyChart Video Encounter and verified that I am speaking with the correct person using two identifiers. I discussed the limitations, risks, security and privacy concerns of performing an evaluation and management service virtually and the availability of in person appointments. I also discussed with the patient that there may be a patient responsible charge related to this service. The patient expressed understanding and agreed to proceed. Subjective:  Rebekah Lloyd is a 25 y.o. G3P2002 at [redacted]w[redacted]d being seen today for ongoing prenatal care.  She is currently monitored for the following issues for this low-risk pregnancy and has History of chlamydia; HSV-2 infection complicating pregnancy; Supervision of low-risk pregnancy, unspecified trimester; and Asthma complicating pregnancy, antepartum on their problem list.  Patient reports no complaints.  Contractions: Not present. Vag. Bleeding: None.  Movement: Absent. Denies any leaking of fluid.   The following portions of the patient's history were reviewed and updated as appropriate: allergies, current medications, past family history, past medical history, past social history, past surgical history and problem list.   Objective:  There were no vitals filed for this visit.  Fetal Status:     Movement: Absent     General:  Alert, oriented and cooperative. Patient is in no acute distress.  Respiratory: Normal respiratory effort, no problems with respiration noted  Mental Status: Normal mood and affect. Normal behavior. Normal judgment and thought content.  Rest of physical exam deferred due to type of encounter  Imaging: No results found.  Assessment and Plan:  Pregnancy: G3P2002 at [redacted]w[redacted]d 1. Supervision of low-risk pregnancy,  unspecified trimester Stable - [place] MFM OB DETAIL +14 WK; Future  2. 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 Asthma complicating pregnancy, antepartum Stable Regiment from last visit is helping. OTC Flonase for allergies  Preterm labor symptoms and general obstetric precautions including but not limited to vaginal bleeding, contractions, leaking of fluid and fetal movement were reviewed in detail with the patient. I discussed the assessment and treatment plan with the patient. The patient was provided an opportunity to ask questions and all were answered. The patient agreed with the plan and demonstrated an understanding of the instructions. The patient was advised to call back or seek an in-person office evaluation/go to MAU at [facility] for any urgent or concerning symptoms. Please refer to After Visit Summary for other counseling recommendations.   I provided 8 minutes of face-to-face time during this encounter.  Return in about 4 weeks (around 12/08/2020) for OB visit, face to face, any provider.  Future Appointments  Date Time Provider Department Center  11/10/2020 10:15 AM Hermina Staggers, MD [facility]    Hermina Staggers, MD Center for [facility], [facility]

## 2020-11-10 NOTE — Patient Instructions (Signed)

## 2020-11-10 NOTE — Progress Notes (Signed)
Pt states will take BP when she gets home & record in BRx.

## 2020-11-15 ENCOUNTER — Other Ambulatory Visit: Payer: Self-pay | Admitting: *Deleted

## 2020-11-15 DIAGNOSIS — Z3490 Encounter for supervision of normal pregnancy, unspecified, unspecified trimester: Secondary | ICD-10-CM

## 2020-11-15 NOTE — Progress Notes (Signed)
Per message Ly has not received [place] appointment. Called MFM to schedule and was told need to change order to complete because no high risk indicatiors. New order placed and [place] scheduled. Messaged [facility].  Lilliahna Schubring,RN

## 2020-11-29 ENCOUNTER — Other Ambulatory Visit: Payer: Self-pay | Admitting: Lactation Services

## 2020-11-29 MED ORDER — VALACYCLOVIR HCL 1 G PO TABS: 1000.0000 mg | ORAL_TABLET | Freq: Every day | ORAL | 99 refills | Status: DC

## 2020-11-29 NOTE — Progress Notes (Signed)
Valtrex prescription sent to Pharmacy per Dr. Barb Merino.

## 2020-12-05 ENCOUNTER — Ambulatory Visit: Payer: Medicaid Other | Attending: Obstetrics and Gynecology

## 2020-12-05 ENCOUNTER — Other Ambulatory Visit: Payer: Self-pay

## 2020-12-05 DIAGNOSIS — Z3490 Encounter for supervision of normal pregnancy, unspecified, unspecified trimester: Secondary | ICD-10-CM | POA: Diagnosis not present

## 2020-12-12 ENCOUNTER — Other Ambulatory Visit: Payer: Self-pay

## 2020-12-12 ENCOUNTER — Ambulatory Visit (INDEPENDENT_AMBULATORY_CARE_PROVIDER_SITE_OTHER): Payer: Medicaid Other | Admitting: Student

## 2020-12-12 VITALS — BP 111/72 | HR 76 | Wt 147.8 lb

## 2020-12-12 DIAGNOSIS — Z3A20 20 weeks gestation of pregnancy: Secondary | ICD-10-CM

## 2020-12-12 DIAGNOSIS — Z3490 Encounter for supervision of normal pregnancy, unspecified, unspecified trimester: Secondary | ICD-10-CM

## 2020-12-12 NOTE — Progress Notes (Signed)
   PRENATAL VISIT NOTE  Subjective:  AERICA Lloyd is a 25 y.o. G3P2002 at [redacted]w[redacted]d being seen today for ongoing prenatal care.  She is currently monitored for the following issues for this low-risk pregnancy and has History of chlamydia; HSV-2 infection complicating pregnancy; Supervision of low-risk pregnancy, unspecified trimester; and Asthma complicating pregnancy, antepartum on their problem list.  Patient reports no complaints. She feels well. She is interested in BTL. Will do AFP today. Reviewed NOB labs. Patient discussed her sadness over the passing of her father due to COVID and cancer. She reports that she has a lot of support at home.  Contractions: Not present. Vag. Bleeding: None.  Movement: Present. Denies leaking of fluid.   The following portions of the patient's history were reviewed and updated as appropriate: allergies, current medications, past family history, past medical history, past social history, past surgical history and problem list.   Objective:   Vitals:   12/12/20 0942  BP: 111/72  Pulse: 76  Weight: 147 lb 12.8 oz (67 kg)    Fetal Status: Fetal Heart Rate (bpm): 148 Fundal Height: 19 cm Movement: Present     General:  Alert, oriented and cooperative. Patient is in no acute distress.  Skin: Skin is warm and dry. No rash noted.   Cardiovascular: Normal heart rate noted  Respiratory: Normal respiratory effort, no problems with respiration noted  Abdomen: Soft, gravid, appropriate for gestational age.  Pain/Pressure: Absent     Pelvic: Cervical exam deferred        Extremities: Normal range of motion.  Edema: None  Mental Status: Normal mood and affect. Normal behavior. Normal judgment and thought content.   Assessment and Plan:  Pregnancy: G3P2002 at [redacted]w[redacted]d   1. [redacted] weeks gestation of pregnancy   2. Supervision of low-risk pregnancy, unspecified trimester   -discussed coping mechanisms of stress, sadness after the death of her father from  COVID -interested in BTL; will discuss at next visit Preterm labor symptoms and general obstetric precautions including but not limited to vaginal bleeding, contractions, leaking of fluid and fetal movement were reviewed in detail with the patient. Please refer to After Visit Summary for other counseling recommendations.   Return in about 4 weeks (around 01/09/2021), or LROB with KK.  Future Appointments  Date Time Provider Department Center  01/09/2021  2:55 PM Marylene Land, CNM [facility]    Charlesetta Garibaldi [place], [place]

## 2020-12-14 LAB — AFP, SERUM, OPEN SPINA BIFIDA
AFP MoM: 0.57
AFP Value: 39.8 ng/mL
Gest. Age on Collection Date: 20.3 weeks
Maternal Age At EDD: 23.7 yr
OSBR Risk 1 IN: 10000
Test Results:: NEGATIVE
Weight: 147 [lb_av]

## 2021-01-09 ENCOUNTER — Encounter: Payer: Medicaid Other | Admitting: Student

## 2021-01-09 ENCOUNTER — Other Ambulatory Visit: Payer: Self-pay

## 2021-01-19 ENCOUNTER — Other Ambulatory Visit: Payer: Self-pay

## 2021-01-19 ENCOUNTER — Ambulatory Visit (INDEPENDENT_AMBULATORY_CARE_PROVIDER_SITE_OTHER): Payer: Medicaid Other | Admitting: Student

## 2021-01-19 VITALS — BP 124/78 | HR 79 | Wt 156.6 lb

## 2021-01-19 DIAGNOSIS — Z3490 Encounter for supervision of normal pregnancy, unspecified, unspecified trimester: Secondary | ICD-10-CM

## 2021-01-19 DIAGNOSIS — Z3A25 25 weeks gestation of pregnancy: Secondary | ICD-10-CM

## 2021-01-19 NOTE — Patient Instructions (Signed)
-  come fasting  to that appt; nothing to eat or drink after midnight 8 hours before

## 2021-01-19 NOTE — Progress Notes (Signed)
Subjective:  Rebekah Lloyd is a 25 y.o. G3P2002 at [redacted]w[redacted]d being seen today for prenatal care.  Patient reports no complaints.  Contractions: Not present.  Vag. Bleeding: None. Movement: Present. Denies leaking of fluid.   The following portions of the patient's history were reviewed and updated as appropriate: allergies, current medications, past family history, past medical history, past social history, past surgical history and problem list.   Objective:   Vitals:   01/19/21 1323  BP: 124/78  Pulse: 79  Weight: 156 lb 9.6 oz (71 kg)    Fetal Status: Fetal Heart Rate (bpm): 145 Fundal Height: 25 cm Movement: Present     General:  Alert, oriented and cooperative. Patient is in no acute distress.  Skin: Skin is warm and dry. No rash noted.   Cardiovascular: Normal heart rate noted  Respiratory: Normal respiratory effort, no problems with respiration noted  Abdomen: Soft, gravid, appropriate for gestational age. Pain/Pressure: Absent     Vaginal: Vag. Bleeding: None.       Cervix: Not evaluated        Extremities: Normal range of motion.  Edema: None  Mental Status: Normal mood and affect. Normal behavior. Normal judgment and thought content.    Assessment and Plan:  Pregnancy: G3P2002 at [redacted]w[redacted]d  1. Supervision of low-risk pregnancy, unspecified trimester - Patient has no concerns today - Discussed fasting prior to next visit for GTT  - Discussed contraception options today, patient seems to have interest in PP IUD or Nexplanon  2. [redacted] weeks gestation of pregnancy   Preterm labor symptoms and general obstetric precautions including but not limited to vaginal bleeding, contractions, leaking of fluid and fetal movement were reviewed in detail with the patient. Please refer to After Visit Summary for other counseling recommendations.  Return in about 4 weeks (around 02/16/2021), or LROB for 2 hour GTT with KK.   Marylene Land, CNM

## 2021-01-30 IMAGING — US US MFM OB COMP +14 WKS
1 series · 13 of 28 positions shown · non-contrast
Comparison: none

[Series 1: us mfm ob comp +14 wks · 104 acquisitions, 13 frames shown]
[im 4/104]
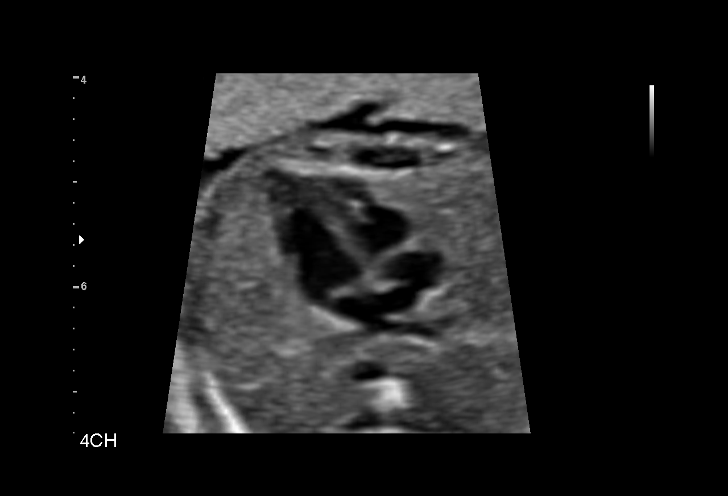
[im 12/104]
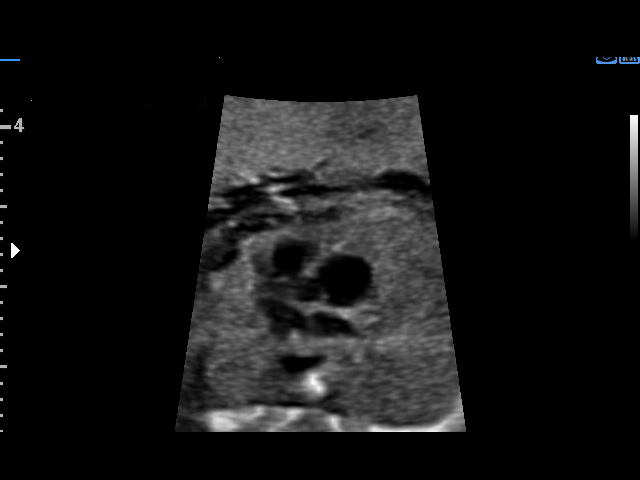
[im 20/104]
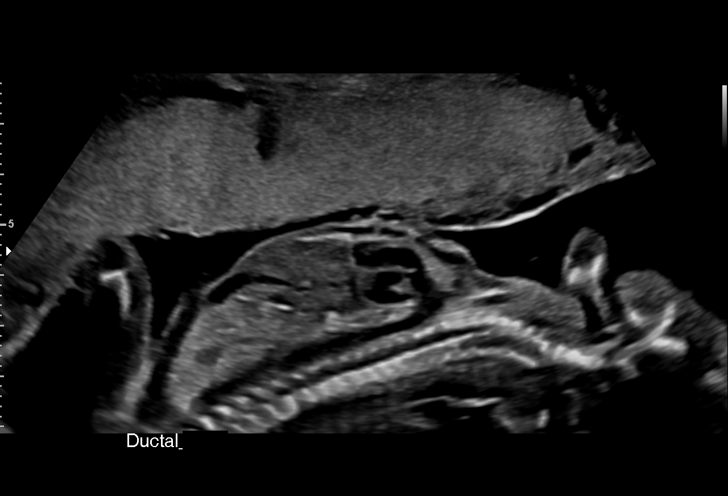
[im 27/104]
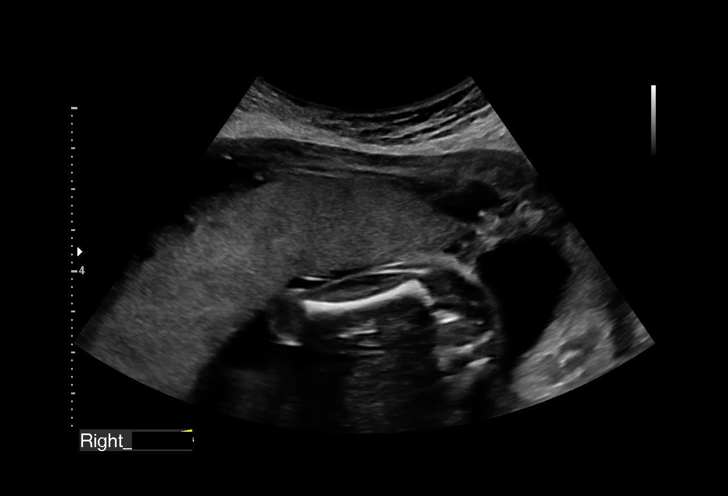
[im 35/104]
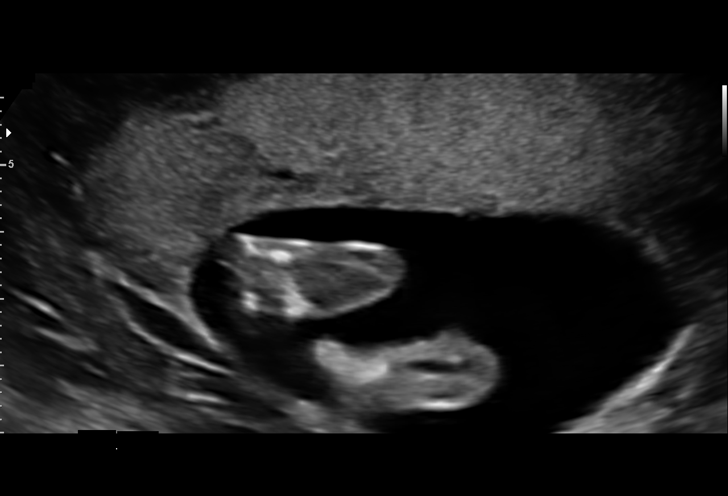
[im 42/104]
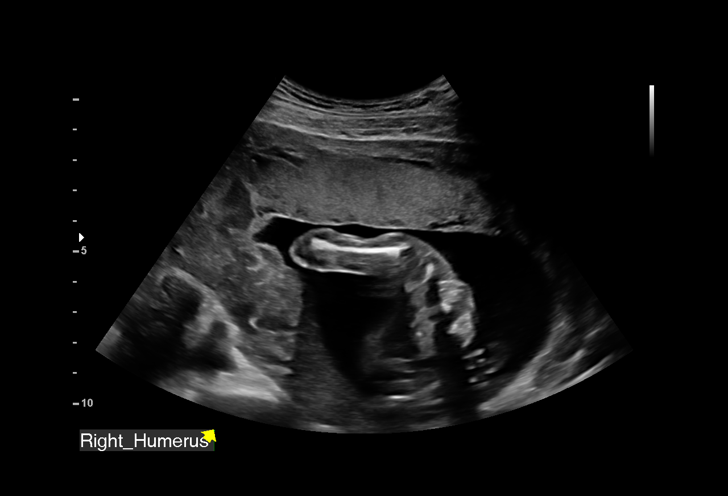
[im 54/104]
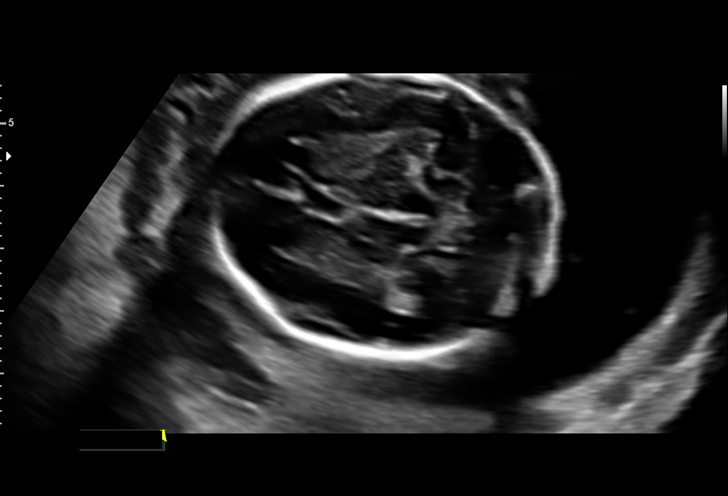
[im 62/104]
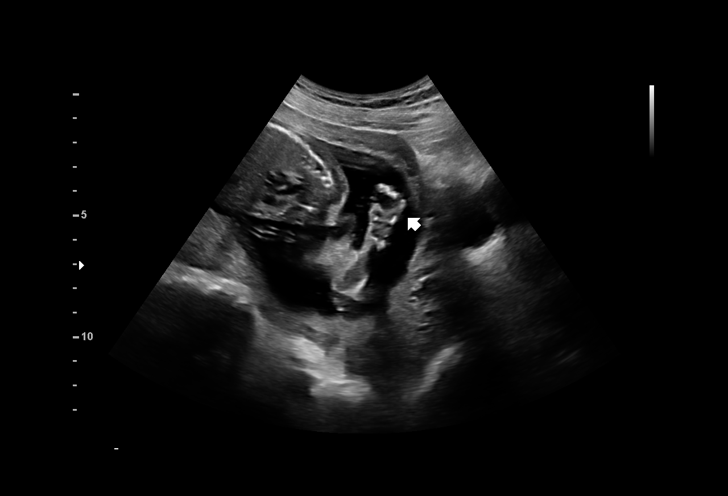
[im 69/104]
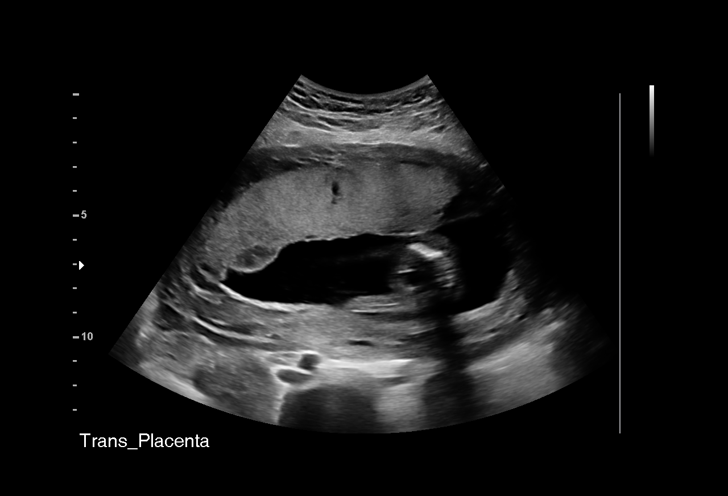
[im 77/104]
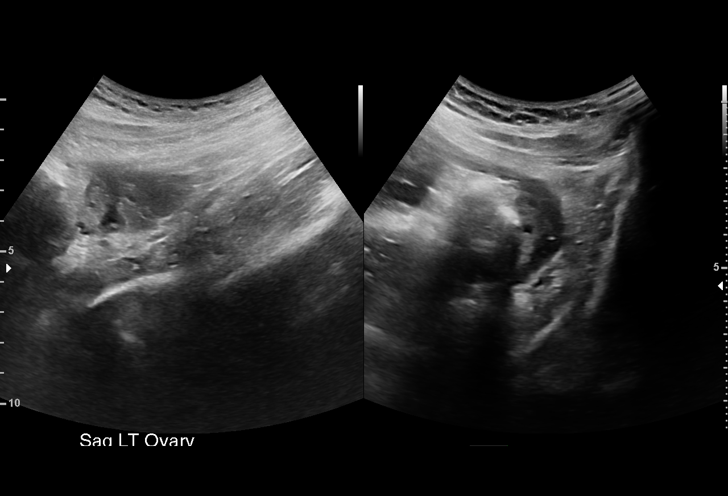
[im 84/104]
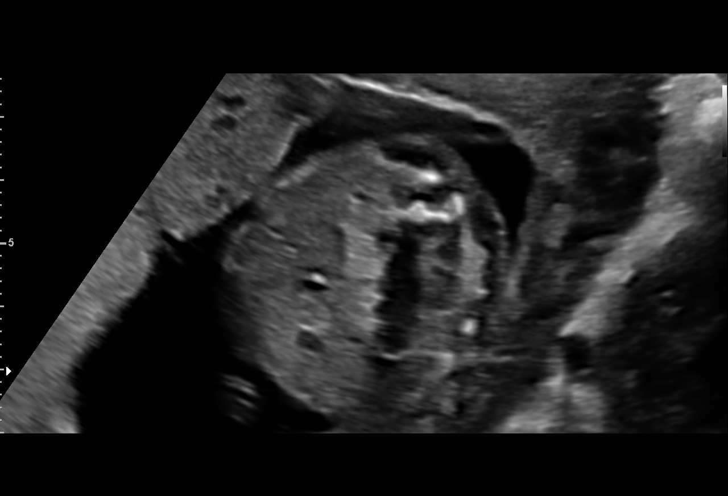
[im 92/104]
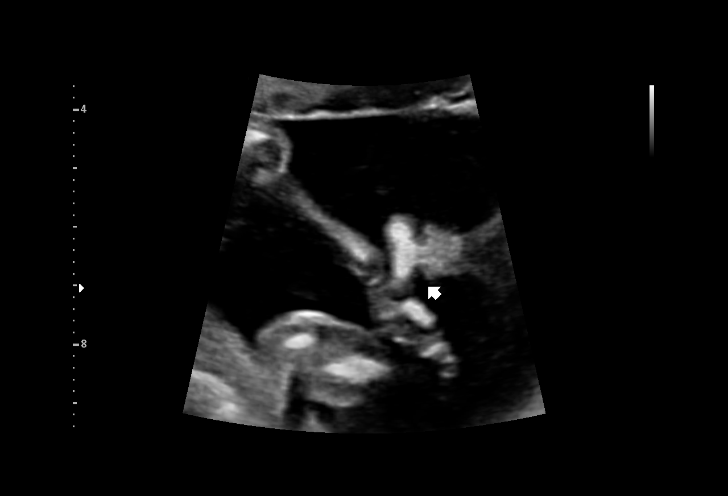
[im 100/104]
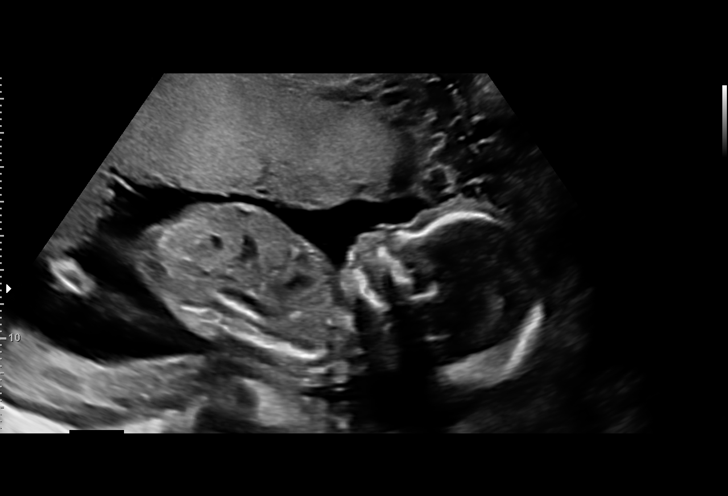

[13 of 28 positions shown; findings below may reference images not displayed]

[facility] [[facility]OS[facility]]
                   EVGENY CNM

  1  US MFM OB COMP + 14 WK               76805.01     ASHISH RANSOM
 ----------------------------------------------------------------------

 ----------------------------------------------------------------------
Indications

  Encounter for antenatal screening for
  malformations
  19 weeks gestation of pregnancy
 ----------------------------------------------------------------------
Fetal Evaluation

 Num Of Fetuses:         1
 Fetal Heart Rate(bpm):  143
 Cardiac Activity:       Observed
 Presentation:           Cephalic
 Placenta:               Anterior
 P. Cord Insertion:      Visualized

 Amniotic Fluid
 AFI FV:      Within normal limits

                             Largest Pocket(cm)

Biometry

 BPD:      44.3  mm     G. Age:  19w 3d         62  %    CI:        76.22   %    70 - 86
                                                         FL/HC:      18.1   %    16.1 -
 HC:      160.8  mm     G. Age:  18w 6d         30  %    HC/AC:      1.24        1.09 -
 AC:       130   mm     G. Age:  18w 4d         26  %    FL/BPD:     65.7   %
 FL:       29.1  mm     G. Age:  19w 0d         36  %    FL/AC:      22.4   %    20 - 24
 HUM:      29.5  mm     G. Age:  19w 5d         64  %
 CER:      18.7  mm     G. Age:  18w 2d         27  %
 NFT:         3  mm
 LV:        5.6  mm
 CM:          5  mm

 Est. FW:     257  gm      0 lb 9 oz     26  %
OB History

 Gravidity:    2         Term:   1
 Living:       1
Gestational Age

 LMP:           19w 1d        Date:  11/17/18                 EDD:   08/24/19
 U/S Today:     19w 0d                                        EDD:   08/25/19
 Best:          19w 1d     Det. By:  LMP  (11/17/18)          EDD:   08/24/19
Anatomy

 Cranium:               Appears normal         LVOT:                   Appears normal
 Cavum:                 Appears normal         Aortic Arch:            Appears normal
 Ventricles:            Appears normal         Ductal Arch:            Appears normal
 Choroid Plexus:        Appears normal         Diaphragm:              Appears normal
 Cerebellum:            Appears normal         Stomach:                Appears normal, left
                                                                       sided
 Posterior Fossa:       Appears normal         Abdomen:                Appears normal
 Nuchal Fold:           Appears normal         Abdominal Wall:         Appears nml (cord
                                                                       insert, abd wall)
 Face:                  Appears normal         Cord Vessels:           Appears normal (3
                        (orbits and profile)                           vessel cord)
 Lips:                  Appears normal         Kidneys:                Appear normal
 Palate:                Not well visualized    Bladder:                Appears normal
 Thoracic:              Appears normal         Spine:                  Appears normal
 Heart:                 Appears normal         Upper Extremities:      Appears normal
                        (4CH, axis, and
                        situs)
 RVOT:                  Appears normal         Lower Extremities:      Appears normal

 Other:  Fetus appears to be female. Heels visualized. Nasal bone visualized.
         Technically difficult due to fetal position and movement.
Cervix Uterus Adnexa

 Cervix
 Length:           2.72  cm.

 Uterus
 No abnormality visualized.

 Left Ovary
 Size(cm)        2   x    1     x  1.1       Vol(ml):
 Within normal limits.

 Right Ovary
 Size(cm)        3   x   2.3    x  2.4       Vol(ml):
 Within normal limits.
Impression

 Normal interval growth.  No ultrasonic evidence of structural
 fetal anomalies.
 Good fetal movement and amniotic fluid.
Recommendations

 Follow up as clinically indicated.

## 2021-02-23 ENCOUNTER — Other Ambulatory Visit: Payer: Self-pay

## 2021-02-23 ENCOUNTER — Ambulatory Visit (INDEPENDENT_AMBULATORY_CARE_PROVIDER_SITE_OTHER): Payer: Medicaid Other | Admitting: Student

## 2021-02-23 ENCOUNTER — Other Ambulatory Visit: Payer: Medicaid Other

## 2021-02-23 ENCOUNTER — Other Ambulatory Visit: Payer: Self-pay | Admitting: General Practice

## 2021-02-23 VITALS — BP 122/64 | HR 79 | Wt 159.7 lb

## 2021-02-23 DIAGNOSIS — Z3490 Encounter for supervision of normal pregnancy, unspecified, unspecified trimester: Secondary | ICD-10-CM

## 2021-02-23 DIAGNOSIS — Z3A30 30 weeks gestation of pregnancy: Secondary | ICD-10-CM

## 2021-02-23 NOTE — Progress Notes (Signed)
   PRENATAL VISIT NOTE  Subjective:  Rebekah Lloyd is a 25 y.o. G3P2002 at [redacted]w[redacted]d being seen today for ongoing prenatal care.  She is currently monitored for the following issues for this low-risk pregnancy and has History of chlamydia; HSV-2 infection complicating pregnancy; Supervision of low-risk pregnancy, unspecified trimester; and Asthma complicating pregnancy, antepartum on their problem list.  Patient reports no complaints.  Contractions: Irritability. Vag. Bleeding: None.  Movement: Present. Denies leaking of fluid.   The following portions of the patient's history were reviewed and updated as appropriate: allergies, current medications, past family history, past medical history, past social history, past surgical history and problem list.   Objective:   Vitals:   02/23/21 0912  BP: 122/64  Pulse: 79  Weight: 159 lb 11.2 oz (72.4 kg)    Fetal Status: Fetal Heart Rate (bpm): 152 Fundal Height: 30 cm Movement: Present     General:  Alert, oriented and cooperative. Patient is in no acute distress.  Skin: Skin is warm and dry. No rash noted.   Cardiovascular: Normal heart rate noted  Respiratory: Normal respiratory effort, no problems with respiration noted  Abdomen: Soft, gravid, appropriate for gestational age.  Pain/Pressure: Absent     Pelvic: Cervical exam deferred        Extremities: Normal range of motion.  Edema: None  Mental Status: Normal mood and affect. Normal behavior. Normal judgment and thought content.   Assessment and Plan:  Pregnancy: G3P2002 at [redacted]w[redacted]d  1. [redacted] weeks gestation of pregnancy   -patient now interested in IUD -2 hour GTT in process -continue routine care  Preterm labor symptoms and general obstetric precautions including but not limited to vaginal bleeding, contractions, leaking of fluid and fetal movement were reviewed in detail with the patient. Please refer to After Visit Summary for other counseling recommendations.   Return in about 2  weeks (around 03/09/2021), or with KK.  Future Appointments  Date Time Provider Department Center  03/16/2021  3:55 PM Marylene Land, CNM [facility]    Charlesetta Garibaldi [place], [place]

## 2021-02-24 LAB — GLUCOSE TOLERANCE, 2 HOURS W/ 1HR
Glucose, 1 hour: 74 mg/dL (ref 65–179)
Glucose, 2 hour: 67 mg/dL (ref 65–152)
Glucose, Fasting: 82 mg/dL (ref 65–91)

## 2021-02-24 LAB — CBC
Hematocrit: 29.4 % — ABNORMAL LOW (ref 34.0–46.6)
Hemoglobin: 9.6 g/dL — ABNORMAL LOW (ref 11.1–15.9)
MCH: 28.2 pg (ref 26.6–33.0)
MCHC: 32.7 g/dL (ref 31.5–35.7)
MCV: 86 fL (ref 79–97)
Platelets: 283 10*3/uL (ref 150–450)
RBC: 3.41 x10E6/uL — ABNORMAL LOW (ref 3.77–5.28)
RDW: 13.5 % (ref 11.7–15.4)
WBC: 5.4 10*3/uL (ref 3.4–10.8)

## 2021-02-24 LAB — HIV ANTIBODY (ROUTINE TESTING W REFLEX): HIV Screen 4th Generation wRfx: NONREACTIVE

## 2021-02-24 LAB — RPR: RPR Ser Ql: NONREACTIVE

## 2021-03-12 ENCOUNTER — Other Ambulatory Visit: Payer: Self-pay | Admitting: Student

## 2021-03-12 MED ORDER — FERROUS SULFATE 325 (65 FE) MG PO TABS: 325.0000 mg | ORAL_TABLET | ORAL | 3 refills | Status: DC

## 2021-03-16 ENCOUNTER — Ambulatory Visit (INDEPENDENT_AMBULATORY_CARE_PROVIDER_SITE_OTHER): Payer: Medicaid Other | Admitting: Student

## 2021-03-16 ENCOUNTER — Other Ambulatory Visit: Payer: Self-pay

## 2021-03-16 VITALS — BP 122/73 | HR 84 | Wt 165.6 lb

## 2021-03-16 DIAGNOSIS — Z3A33 33 weeks gestation of pregnancy: Secondary | ICD-10-CM

## 2021-03-16 DIAGNOSIS — Z3490 Encounter for supervision of normal pregnancy, unspecified, unspecified trimester: Secondary | ICD-10-CM

## 2021-03-16 NOTE — Progress Notes (Signed)
Patient stated that she is feeling fine and denies any pain and vaginal bleeding

## 2021-03-16 NOTE — Progress Notes (Signed)
   PRENATAL VISIT NOTE  Subjective:  Rebekah Lloyd is a 25 y.o. G3P2002 at [redacted]w[redacted]d being seen today for ongoing prenatal care.  She is currently monitored for the following issues for this low-risk pregnancy and has History of chlamydia; HSV-2 infection complicating pregnancy; Supervision of low-risk pregnancy, unspecified trimester; and Asthma complicating pregnancy, antepartum on their problem list.  Patient reports no complaints.  Contractions: Irritability. Vag. Bleeding: None.  Movement: Present. Denies leaking of fluid.   The following portions of the patient's history were reviewed and updated as appropriate: allergies, current medications, past family history, past medical history, past social history, past surgical history and problem list.   Objective:   Vitals:   03/16/21 1600  BP: 122/73  Pulse: 84  Weight: 165 lb 9.6 oz (75.1 kg)    Fetal Status: Fetal Heart Rate (bpm): 152 Fundal Height: 31 cm Movement: Present     General:  Alert, oriented and cooperative. Patient is in no acute distress.  Skin: Skin is warm and dry. No rash noted.   Cardiovascular: Normal heart rate noted  Respiratory: Normal respiratory effort, no problems with respiration noted  Abdomen: Soft, gravid, appropriate for gestational age.  Pain/Pressure: Absent     Pelvic: Cervical exam deferred        Extremities: Normal range of motion.  Edema: None  Mental Status: Normal mood and affect. Normal behavior. Normal judgment and thought content.   Assessment and Plan:  Pregnancy: G3P2002 at [redacted]w[redacted]d 1. Supervision of low-risk pregnancy, unspecified trimester   2. [redacted] weeks gestation of pregnancy   -anticipatory for guidance for next visit and 36 week cultures -confirmed patient wants IUD (in the hospital) -patient will start taking valtrex at 36 weeks   Preterm labor symptoms and general obstetric precautions including but not limited to vaginal bleeding, contractions, leaking of fluid and fetal movement  were reviewed in detail with the patient. Please refer to After Visit Summary for other counseling recommendations.   Return 3-4 weeks for LROB with KK.  Future Appointments  Date Time Provider Department Center  04/03/2021  2:55 PM Marylene Land, CNM [facility]    Charlesetta Garibaldi [place], [place]

## 2021-03-22 ENCOUNTER — Other Ambulatory Visit: Payer: Self-pay

## 2021-03-22 ENCOUNTER — Inpatient Hospital Stay (HOSPITAL_COMMUNITY)
Admission: AD | Admit: 2021-03-22 | Discharge: 2021-03-22 | Disposition: A | Discharge status: Home / Self Care | Payer: Medicaid Other | Attending: Obstetrics and Gynecology | Admitting: Obstetrics and Gynecology

## 2021-03-22 ENCOUNTER — Encounter (HOSPITAL_COMMUNITY): Payer: Self-pay | Admitting: Obstetrics and Gynecology

## 2021-03-22 DIAGNOSIS — O26893 Other specified pregnancy related conditions, third trimester: Secondary | ICD-10-CM | POA: Diagnosis present

## 2021-03-22 DIAGNOSIS — O98813 Other maternal infectious and parasitic diseases complicating pregnancy, third trimester: Secondary | ICD-10-CM | POA: Diagnosis not present

## 2021-03-22 DIAGNOSIS — B3731 Acute candidiasis of vulva and vagina: Secondary | ICD-10-CM | POA: Insufficient documentation

## 2021-03-22 DIAGNOSIS — Z0371 Encounter for suspected problem with amniotic cavity and membrane ruled out: Secondary | ICD-10-CM

## 2021-03-22 DIAGNOSIS — Z3A34 34 weeks gestation of pregnancy: Secondary | ICD-10-CM | POA: Insufficient documentation

## 2021-03-22 LAB — URINALYSIS, ROUTINE W REFLEX MICROSCOPIC
Bilirubin Urine: NEGATIVE
Glucose, UA: NEGATIVE mg/dL
Hgb urine dipstick: NEGATIVE
Ketones, ur: NEGATIVE mg/dL
Nitrite: NEGATIVE
Protein, ur: NEGATIVE mg/dL
Specific Gravity, Urine: 1.013 (ref 1.005–1.030)
pH: 7 (ref 5.0–8.0)

## 2021-03-22 LAB — WET PREP, GENITAL
Clue Cells Wet Prep HPF POC: NONE SEEN
Sperm: NONE SEEN
Trich, Wet Prep: NONE SEEN

## 2021-03-22 LAB — AMNISURE RUPTURE OF MEMBRANE (ROM) NOT AT ARMC: Amnisure ROM: NEGATIVE

## 2021-03-22 MED ORDER — FLUCONAZOLE 150 MG PO TABS: 150.0000 mg | ORAL_TABLET | Freq: Once | ORAL | 0 refills | Status: AC

## 2021-03-22 NOTE — MAU Note (Signed)
Pt reports that she had small gush of clear fluid around 10:30 this morning. Pt is unaware if she has had any leaking since then. Pt denies any vaginal bleeding or abdominal pain. Pt denies odor to vaginal fluid.

## 2021-03-22 NOTE — MAU Provider Note (Signed)
History     CSN: [id number]  Arrival date and time: 03/22/21 1716   Event Date/Time   First Provider Initiated Contact with Patient 03/22/21 1829      Chief Complaint  Patient presents with   Rupture of Membranes    Leaking fluids   Ms. Rebekah Lloyd is a 25 y.o. year old G35P2002 female at [redacted]w[redacted]d weeks gestation who presents to MAU reporting she had a small gush of clear fluid @ 1030 today. She noticed the fluid on her underwear, but it did not soak through to her pajama bottoms. She has not leaked since that one episode. She denies VB, vaginal odor, abnormal vaginal discharge, or abdominal pain. She receives [facility] with [facility].   OB History     Gravida  3   Para  2   Term  2   Preterm  0   AB  0   Living  2      SAB  0   IAB  0   Ectopic  0   Multiple  0   Live Births  2           Past Medical History:  Diagnosis Date   Medical history non-contributory     Past Surgical History:  Procedure Laterality Date   NO PAST SURGERIES      Family History  Problem Relation Age of Onset   Colon cancer Father     Social History   Tobacco Use   Smoking status: Never   Smokeless tobacco: Never  Vaping Use   Vaping Use: Never used  Substance Use Topics   Alcohol use: No   Drug use: Not Currently    Types: Marijuana    Allergies: No Known Allergies  Medications Prior to Admission  Medication Sig Dispense Refill Last Dose   prenatal vitamin w/FE, FA (PRENATAL 1 + 1) 27-1 MG TABS tablet Take 1 tablet by mouth daily at 12 noon. 30 tablet 11 03/22/2021   albuterol (VENTOLIN HFA) 108 (90 Base) MCG/ACT inhaler 1-2 puff as needed   More than a month   Blood Pressure Monitoring DEVI 1 Device by Does not apply route once a week. OCD 10 ; Z34.90 (Patient not taking: Reported on 03/16/2021) 1 Device 0    ferrous sulfate 325 (65 FE) MG tablet Take 1 tablet (325 mg total) by mouth every other day. (Patient not taking: Reported on 03/16/2021) 90 tablet 3    PROAIR HFA  108 (90 Base) MCG/ACT inhaler SMARTSIG:2 Puff(s) By Mouth Every 6 Hours PRN (Patient not taking: Reported on 03/16/2021)      valACYclovir (VALTREX) 1000 MG tablet Take 1 tablet (1,000 mg total) by mouth daily. 5 tablet PRN More than a month    Review of Systems  Constitutional: Negative.   HENT: Negative.    Eyes: Negative.   Respiratory: Negative.    Cardiovascular: Negative.   Gastrointestinal: Negative.   Endocrine: Negative.   Genitourinary:  Positive for vaginal discharge (leaked fluid that wet underwear at ~1030 this AM).  Musculoskeletal: Negative.   Skin: Negative.   Allergic/Immunologic: Negative.   Neurological: Negative.   Hematological: Negative.   Psychiatric/Behavioral: Negative.    Physical Exam   Blood pressure 139/89, pulse 89, temperature 98.3 F (36.8 C), temperature source Oral, resp. rate 17, height 5\' 7"  (1.702 m), weight 75.8 kg, last menstrual period 07/22/2020, SpO2 100 %.  Physical Exam Vitals and nursing note reviewed. Exam conducted with a chaperone present.  Constitutional:  Appearance: Normal appearance. She is normal weight.  Pulmonary:     Effort: Pulmonary effort is normal.  Abdominal:     Palpations: Abdomen is soft.  Genitourinary:    General: Normal vulva.     Comments: Pelvic exam: External genitalia normal, SE: vaginal walls pink and well rugated, cervix is smooth, pink, no lesions, moderate amt of watery, white vaginal d/c with adherent curdy d/c on vaginal walls -- fern slide, WP and Amnisure done, cervix visually closed, cervical exam deferred until ROM is determined. Skin:    General: Skin is warm and dry.  Neurological:     Mental Status: She is alert and oriented to person, place, and time.  Psychiatric:        Mood and Affect: Mood normal.        Behavior: Behavior normal.        Thought Content: Thought content normal.        Judgment: Judgment normal.   REACTIVE NST - FHR: 140 bpm / moderate variability / accels present /  decels absent / TOCO: UI noted  MAU Course  Procedures  MDM Wet Prep  Crist Fat Slide Amnisure  Results for orders placed or performed during the hospital encounter of 03/22/21 (from the past 24 hour(s))  Amnisure rupture of membrane (rom)not at [facility]     Status: None   Collection Time: 03/22/21  6:21 PM  Result Value Ref Range   Amnisure ROM NEGATIVE   Wet prep, genital     Status: Abnormal   Collection Time: 03/22/21  6:46 PM   Specimen: Urine, Clean Catch  Result Value Ref Range   Yeast Wet Prep HPF POC PRESENT (A) NONE SEEN   Trich, Wet Prep NONE SEEN NONE SEEN   Clue Cells Wet Prep HPF POC NONE SEEN NONE SEEN   WBC, Wet Prep HPF POC MANY (A) NONE SEEN   Sperm NONE SEEN     Assessment and Plan  1. No leakage of amniotic fluid into vagina - Reassurance given that fern slide and Amnisure tests results were both negative and watery discharge can be seen with yeast infection; which is what she has been dx'd with tonight  2. Candida vaginitis - Rx for Diflucan 150 mg po x 1 tablet - Information provided on vaginal yeast infection   3. [redacted] weeks gestation of pregnancy  - Discharge patient - Keep scheduled appt with [facility] on 04/03/21 - Patient verbalized an understanding of the plan of care and agrees.   Raelyn Mora, CNM 03/22/2021, 6:30 PM

## 2021-04-03 ENCOUNTER — Ambulatory Visit (INDEPENDENT_AMBULATORY_CARE_PROVIDER_SITE_OTHER): Payer: Medicaid Other | Admitting: Student

## 2021-04-03 ENCOUNTER — Other Ambulatory Visit (HOSPITAL_COMMUNITY)
Admission: RE | Admit: 2021-04-03 | Discharge: 2021-04-03 | Disposition: A | Discharge status: Home / Self Care | Payer: Medicaid Other | Source: Ambulatory Visit | Attending: Student | Admitting: Student

## 2021-04-03 ENCOUNTER — Other Ambulatory Visit: Payer: Self-pay

## 2021-04-03 VITALS — BP 119/73 | HR 81

## 2021-04-03 DIAGNOSIS — Z3A36 36 weeks gestation of pregnancy: Secondary | ICD-10-CM

## 2021-04-03 DIAGNOSIS — Z3490 Encounter for supervision of normal pregnancy, unspecified, unspecified trimester: Secondary | ICD-10-CM

## 2021-04-03 MED ORDER — FERROUS SULFATE 325 (65 FE) MG PO TABS: 325.0000 mg | ORAL_TABLET | Freq: Every day | ORAL | 0 refills | Status: DC

## 2021-04-03 MED ORDER — VALACYCLOVIR HCL 1 G PO TABS: 1000.0000 mg | ORAL_TABLET | Freq: Every day | ORAL | 1 refills | Status: AC

## 2021-04-03 NOTE — Progress Notes (Signed)
   PRENATAL VISIT NOTE  Subjective:  Rebekah Lloyd is a 25 y.o. G3P2002 at [redacted]w[redacted]d being seen today for ongoing prenatal care.  She is currently monitored for the following issues for this low-risk pregnancy and has History of chlamydia; HSV-2 infection complicating pregnancy; Supervision of low-risk pregnancy, unspecified trimester; and Asthma complicating pregnancy, antepartum on their problem list.  Patient reports no complaints.  Contractions: Irritability. Vag. Bleeding: None.  Movement: Present. Denies leaking of fluid.   The following portions of the patient's history were reviewed and updated as appropriate: allergies, current medications, past family history, past medical history, past social history, past surgical history and problem list.   Objective:   Vitals:   04/03/21 1531  BP: 119/73  Pulse: 81    Fetal Status: Fetal Heart Rate (bpm): 136 Fundal Height: 37 cm Movement: Present     General:  Alert, oriented and cooperative. Patient is in no acute distress.  Skin: Skin is warm and dry. No rash noted.   Cardiovascular: Normal heart rate noted  Respiratory: Normal respiratory effort, no problems with respiration noted  Abdomen: Soft, gravid, appropriate for gestational age.  Pain/Pressure: Present     Pelvic: Cervical exam deferred        Extremities: Normal range of motion.  Edema: None  Mental Status: Normal mood and affect. Normal behavior. Normal judgment and thought content.   Assessment and Plan:  Pregnancy: G3P2002 at [redacted]w[redacted]d  1. Supervision of low-risk pregnancy, unspecified trimester   2. [redacted] weeks gestation of pregnancy    -doing well; no complaints. Excited about baby shower this weekend!  -cultures today -RX for Iron pills and valtrex given today  Preterm labor symptoms and general obstetric precautions including but not limited to vaginal bleeding, contractions, leaking of fluid and fetal movement were reviewed in detail with the patient. Please refer to  After Visit Summary for other counseling recommendations.   Return in about 2 weeks (around 04/17/2021), or LROB with KK or another midwife.  Future Appointments  Date Time Provider Department Center  04/20/2021  3:35 PM Berle Mull [facility]    Charlesetta Garibaldi [place], [place]

## 2021-04-04 LAB — CERVICOVAGINAL ANCILLARY ONLY
Chlamydia: NEGATIVE
Comment: NEGATIVE
Comment: NORMAL
Neisseria Gonorrhea: NEGATIVE

## 2021-04-07 LAB — CULTURE, BETA STREP (GROUP B ONLY): Strep Gp B Culture: NEGATIVE

## 2021-04-20 ENCOUNTER — Other Ambulatory Visit: Payer: Self-pay

## 2021-04-20 ENCOUNTER — Ambulatory Visit (INDEPENDENT_AMBULATORY_CARE_PROVIDER_SITE_OTHER): Payer: Medicaid Other

## 2021-04-20 VITALS — BP 114/69 | HR 98 | Wt 170.0 lb

## 2021-04-20 DIAGNOSIS — Z3A38 38 weeks gestation of pregnancy: Secondary | ICD-10-CM

## 2021-04-20 DIAGNOSIS — Z3490 Encounter for supervision of normal pregnancy, unspecified, unspecified trimester: Secondary | ICD-10-CM

## 2021-04-20 NOTE — Progress Notes (Signed)
   PRENATAL VISIT NOTE  Subjective:  Rebekah Lloyd is a 25 y.o. G3P2002 at [redacted]w[redacted]d being seen today for ongoing prenatal care.  She is currently monitored for the following issues for this low-risk pregnancy and has History of chlamydia; HSV-2 infection complicating pregnancy; Supervision of low-risk pregnancy, unspecified trimester; and Asthma complicating pregnancy, antepartum on their problem list.  Patient reports irregular contractions.  Contractions: Irritability. Vag. Bleeding: None.  Movement: Present. Denies leaking of fluid.   The following portions of the patient's history were reviewed and updated as appropriate: allergies, current medications, past family history, past medical history, past social history, past surgical history and problem list.   Objective:   Vitals:   04/20/21 1538  BP: 114/69  Pulse: 98  Weight: 170 lb (77.1 kg)    Fetal Status: Fetal Heart Rate (bpm): 144 Fundal Height: 38 cm Movement: Present     General:  Alert, oriented and cooperative. Patient is in no acute distress.  Skin: Skin is warm and dry. No rash noted.   Cardiovascular: Normal heart rate noted  Respiratory: Normal respiratory effort, no problems with respiration noted  Abdomen: Soft, gravid, appropriate for gestational age.  Pain/Pressure: Present     Pelvic: Cervical exam deferred        Extremities: Normal range of motion.  Edema: None  Mental Status: Normal mood and affect. Normal behavior. Normal judgment and thought content.   Assessment and Plan:  Pregnancy: G3P2002 at [redacted]w[redacted]d 1. Supervision of low-risk pregnancy, unspecified trimester - Routine OB care - Doing well. No concerns - Discussed RRT, EPO, and [employer] - Would prefer to go in to labor, but considering 41 week IOL if needed - Anticipatory guidance for upcoming appointments provided  2. [redacted] weeks gestation of pregnancy   Term labor symptoms and general obstetric precautions including but not limited to vaginal  bleeding, contractions, leaking of fluid and fetal movement were reviewed in detail with the patient. Please refer to After Visit Summary for other counseling recommendations.   Return in about 1 week (around 04/27/2021).  Future Appointments  Date Time Provider Department Center  04/28/2021 10:35 AM Adam Phenix, MD [facility]    Brand Males, CNM 04/20/21 4:43 PM

## 2021-04-20 NOTE — Patient Instructions (Signed)
.  [email or web address]

## 2021-04-24 ENCOUNTER — Inpatient Hospital Stay (HOSPITAL_COMMUNITY)
Admission: AD | Admit: 2021-04-24 | Discharge: 2021-04-24 | Disposition: A | Discharge status: Home / Self Care | Payer: Medicaid Other | Attending: Obstetrics and Gynecology | Admitting: Obstetrics and Gynecology

## 2021-04-24 ENCOUNTER — Other Ambulatory Visit: Payer: Self-pay

## 2021-04-24 ENCOUNTER — Encounter (HOSPITAL_COMMUNITY): Payer: Self-pay | Admitting: Obstetrics and Gynecology

## 2021-04-24 DIAGNOSIS — O471 False labor at or after 37 completed weeks of gestation: Secondary | ICD-10-CM | POA: Insufficient documentation

## 2021-04-24 DIAGNOSIS — O479 False labor, unspecified: Secondary | ICD-10-CM

## 2021-04-24 DIAGNOSIS — Z3A39 39 weeks gestation of pregnancy: Secondary | ICD-10-CM | POA: Insufficient documentation

## 2021-04-24 HISTORY — DX: Dyspnea, unspecified: R06.00

## 2021-04-24 NOTE — MAU Note (Signed)
..  Rebekah Lloyd is a 25 y.o. at [redacted]w[redacted]d here in MAU reporting: She took a midwives brew around 1530 and since then had been having contractions. Has not been timing them. Denies vaginal bleeding or leaking of fluid. +FM Pain score: 5/10 Vitals:   04/24/21 1929  BP: 127/77  Pulse: 97  Resp: 18  Temp: 98 F (36.7 C)  SpO2: 98%     FHT:150

## 2021-04-24 NOTE — MAU Note (Signed)
Receive 25 y/o, G3 P2002, pt reported doing a midwives brew and has been having ctx since then, pt reported ctx started at 4pm, rates pain 6/10, pt reported + fetal movement, pt denies any vaginal bleeding or leakage of fluid, safety maintained.

## 2021-04-24 NOTE — MAU Provider Note (Addendum)
Labor Check - provider note  S: Ms. Rebekah Lloyd is a 25 y.o. G3P2002 at [redacted]w[redacted]d  who presents to MAU today complaining contractions q 5 minutes since this morning. She denies vaginal bleeding. She denies LOF. She reports normal fetal movement.    O: BP 129/80 (BP Location: Right Arm)   Pulse 89   Temp 98.4 F (36.9 C) (Oral)   Resp 20   Ht 5\' 7"  (1.702 m)   Wt 76.6 kg   LMP 07/22/2020   SpO2 99%   BMI 26.45 kg/m  I did not personally examine this patient at bedside. I reviewed her history and NST and discussed findings with RN Cervical exam:  Dilation: (P) Closed Effacement (%): (P) 40 Cervical Position: (P) Posterior Station: -3 Exam by:: [email or web address] RN   Fetal Monitoring: Baseline: 135 Variability: moderate Accelerations: present Decelerations: absent Contractions: every 5 minutes   A: SIUP at [redacted]w[redacted]d  False labor  P: Discharging patient to home with return precautions.   [redacted]w[redacted]d, MD 04/24/2021 8:16 PM  GME ATTESTATION:  I saw and evaluated the patient. I agree with the findings and the plan of care as documented in the resident's note.  13/12/2020, MD, MPH OB Fellow, Faculty Practice [facility], [facility] for [facility] 04/24/2021 8:42 PM

## 2021-04-26 ENCOUNTER — Encounter (HOSPITAL_COMMUNITY): Payer: Self-pay | Admitting: Obstetrics & Gynecology

## 2021-04-26 ENCOUNTER — Inpatient Hospital Stay (HOSPITAL_COMMUNITY)
Admission: AD | Admit: 2021-04-26 | Discharge: 2021-04-28 | DRG: 807 | Disposition: A | Discharge status: Home / Self Care | Payer: Medicaid Other | Attending: Obstetrics & Gynecology | Admitting: Obstetrics & Gynecology

## 2021-04-26 ENCOUNTER — Inpatient Hospital Stay (HOSPITAL_COMMUNITY): Payer: Medicaid Other | Admitting: Anesthesiology

## 2021-04-26 ENCOUNTER — Other Ambulatory Visit: Payer: Self-pay

## 2021-04-26 DIAGNOSIS — Z3490 Encounter for supervision of normal pregnancy, unspecified, unspecified trimester: Secondary | ICD-10-CM

## 2021-04-26 DIAGNOSIS — O9832 Other infections with a predominantly sexual mode of transmission complicating childbirth: Principal | ICD-10-CM | POA: Diagnosis present

## 2021-04-26 DIAGNOSIS — Z30430 Encounter for insertion of intrauterine contraceptive device: Secondary | ICD-10-CM | POA: Diagnosis not present

## 2021-04-26 DIAGNOSIS — J4520 Mild intermittent asthma, uncomplicated: Secondary | ICD-10-CM | POA: Diagnosis present

## 2021-04-26 DIAGNOSIS — Z20822 Contact with and (suspected) exposure to covid-19: Secondary | ICD-10-CM | POA: Diagnosis not present

## 2021-04-26 DIAGNOSIS — Z30014 Encounter for initial prescription of intrauterine contraceptive device: Secondary | ICD-10-CM | POA: Diagnosis not present

## 2021-04-26 DIAGNOSIS — J45909 Unspecified asthma, uncomplicated: Secondary | ICD-10-CM | POA: Diagnosis present

## 2021-04-26 DIAGNOSIS — D649 Anemia, unspecified: Secondary | ICD-10-CM | POA: Diagnosis not present

## 2021-04-26 DIAGNOSIS — O9952 Diseases of the respiratory system complicating childbirth: Secondary | ICD-10-CM | POA: Diagnosis present

## 2021-04-26 DIAGNOSIS — O26893 Other specified pregnancy related conditions, third trimester: Secondary | ICD-10-CM | POA: Diagnosis not present

## 2021-04-26 DIAGNOSIS — A6000 Herpesviral infection of urogenital system, unspecified: Secondary | ICD-10-CM | POA: Diagnosis not present

## 2021-04-26 DIAGNOSIS — O9902 Anemia complicating childbirth: Secondary | ICD-10-CM | POA: Diagnosis not present

## 2021-04-26 DIAGNOSIS — Z3A39 39 weeks gestation of pregnancy: Secondary | ICD-10-CM

## 2021-04-26 DIAGNOSIS — Z3689 Encounter for other specified antenatal screening: Secondary | ICD-10-CM | POA: Diagnosis not present

## 2021-04-26 DIAGNOSIS — Z975 Presence of (intrauterine) contraceptive device: Secondary | ICD-10-CM

## 2021-04-26 DIAGNOSIS — B009 Herpesviral infection, unspecified: Secondary | ICD-10-CM | POA: Diagnosis present

## 2021-04-26 LAB — TYPE AND SCREEN
ABO/RH(D): A POS
Antibody Screen: NEGATIVE

## 2021-04-26 LAB — CBC
HCT: 30.9 % — ABNORMAL LOW (ref 36.0–46.0)
Hemoglobin: 9.2 g/dL — ABNORMAL LOW (ref 12.0–15.0)
MCH: 25.2 pg — ABNORMAL LOW (ref 26.0–34.0)
MCHC: 29.8 g/dL — ABNORMAL LOW (ref 30.0–36.0)
MCV: 84.7 fL (ref 80.0–100.0)
Platelets: 321 10*3/uL (ref 150–400)
RBC: 3.65 MIL/uL — ABNORMAL LOW (ref 3.87–5.11)
RDW: 15.1 % (ref 11.5–15.5)
WBC: 8.3 10*3/uL (ref 4.0–10.5)
nRBC: 0 % (ref 0.0–0.2)

## 2021-04-26 LAB — RESP PANEL BY RT-PCR (FLU A&B, COVID) ARPGX2
Influenza A by PCR: NEGATIVE
Influenza B by PCR: NEGATIVE
SARS Coronavirus 2 by RT PCR: NEGATIVE

## 2021-04-26 MED ORDER — LACTATED RINGERS IV SOLN: 500.0000 mL | INTRAVENOUS | Status: DC | PRN

## 2021-04-26 MED ORDER — PHENYLEPHRINE 40 MCG/ML (10ML) SYRINGE FOR IV PUSH (FOR BLOOD PRESSURE SUPPORT): 80.0000 ug | PREFILLED_SYRINGE | INTRAVENOUS | Status: DC | PRN

## 2021-04-26 MED ORDER — ACETAMINOPHEN 325 MG PO TABS: 650.0000 mg | ORAL_TABLET | ORAL | Status: DC | PRN

## 2021-04-26 MED ORDER — OXYTOCIN-SODIUM CHLORIDE 30-0.9 UT/500ML-% IV SOLN
2.5000 [IU]/h | INTRAVENOUS | Status: DC
  Filled 2021-04-26: qty 500

## 2021-04-26 MED ORDER — ONDANSETRON HCL 4 MG/2ML IJ SOLN: 4.0000 mg | Freq: Four times a day (QID) | INTRAMUSCULAR | Status: DC | PRN

## 2021-04-26 MED ORDER — OXYCODONE-ACETAMINOPHEN 5-325 MG PO TABS: 1.0000 | ORAL_TABLET | ORAL | Status: DC | PRN

## 2021-04-26 MED ORDER — FENTANYL-BUPIVACAINE-NACL 0.5-0.125-0.9 MG/250ML-% EP SOLN
EPIDURAL | Status: DC | PRN
  Administered 2021-04-26: 12 mL/h via EPIDURAL

## 2021-04-26 MED ORDER — LACTATED RINGERS IV SOLN: INTRAVENOUS | Status: DC

## 2021-04-26 MED ORDER — EPHEDRINE 5 MG/ML INJ: 10.0000 mg | INTRAVENOUS | Status: DC | PRN

## 2021-04-26 MED ORDER — LIDOCAINE HCL (PF) 1 % IJ SOLN: 30.0000 mL | INTRAMUSCULAR | Status: DC | PRN

## 2021-04-26 MED ORDER — PRENATAL MULTIVITAMIN CH: 1.0000 | ORAL_TABLET | Freq: Every day | ORAL | Status: DC

## 2021-04-26 MED ORDER — LACTATED RINGERS IV SOLN: 500.0000 mL | Freq: Once | INTRAVENOUS | Status: DC

## 2021-04-26 MED ORDER — VALACYCLOVIR HCL 500 MG PO TABS
1000.0000 mg | ORAL_TABLET | Freq: Every day | ORAL | Status: DC
  Administered 2021-04-27 – 2021-04-28 (×2): 1000 mg via ORAL
  Filled 2021-04-26 (×2): qty 2

## 2021-04-26 MED ORDER — FENTANYL-BUPIVACAINE-NACL 0.5-0.125-0.9 MG/250ML-% EP SOLN
12.0000 mL/h | EPIDURAL | Status: DC | PRN
  Filled 2021-04-26: qty 250

## 2021-04-26 MED ORDER — SOD CITRATE-CITRIC ACID 500-334 MG/5ML PO SOLN: 30.0000 mL | ORAL | Status: DC | PRN

## 2021-04-26 MED ORDER — ALBUTEROL SULFATE (2.5 MG/3ML) 0.083% IN NEBU: 3.0000 mL | INHALATION_SOLUTION | RESPIRATORY_TRACT | Status: DC | PRN

## 2021-04-26 MED ORDER — OXYCODONE-ACETAMINOPHEN 5-325 MG PO TABS
2.0000 | ORAL_TABLET | ORAL | Status: DC | PRN
Start: 2021-04-26 — End: 2021-04-27

## 2021-04-26 MED ORDER — OXYTOCIN BOLUS FROM INFUSION
333.0000 mL | Freq: Once | INTRAVENOUS | Status: AC
  Administered 2021-04-27: 333 mL via INTRAVENOUS

## 2021-04-26 MED ORDER — LIDOCAINE HCL (PF) 1 % IJ SOLN
INTRAMUSCULAR | Status: DC | PRN
  Administered 2021-04-26: 10 mL via EPIDURAL
  Administered 2021-04-26: 2 mL via EPIDURAL

## 2021-04-26 MED ORDER — FERROUS SULFATE 325 (65 FE) MG PO TABS
325.0000 mg | ORAL_TABLET | ORAL | Status: DC
  Administered 2021-04-27: 325 mg via ORAL
  Filled 2021-04-26: qty 1

## 2021-04-26 MED ORDER — DIPHENHYDRAMINE HCL 50 MG/ML IJ SOLN: 12.5000 mg | INTRAMUSCULAR | Status: DC | PRN

## 2021-04-26 NOTE — H&P (Addendum)
OBSTETRIC ADMISSION HISTORY AND PHYSICAL  Rebekah Lloyd is a 25 y.o. female G3P2002 with IUP at [redacted]w[redacted]d by LMP presenting for SOL. She reports +FMs, No LOF, no VB, no blurry vision, headaches or peripheral edema, and RUQ pain.  She plans on breast feeding. She requests PP liletta IUD placed for birth control. 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   Past Medical History: Past Medical History:  Diagnosis Date   Dyspnea     Past Surgical History: Past Surgical History:  Procedure Laterality Date   NO PAST SURGERIES      Obstetrical History: OB History     Gravida  3   Para  2   Term  2   Preterm  0   AB  0   Living  2      SAB  0   IAB  0   Ectopic  0   Multiple  0   Live Births  2           Social History Social History   Socioeconomic History   Marital status: Single    Spouse name: Not on file   Number of children: Not on file   Years of education: Not on file   Highest education level: Not on file  Occupational History   Not on file  Tobacco Use   Smoking status: Never   Smokeless tobacco: Never  Vaping Use   Vaping Use: Never used  Substance and Sexual Activity   Alcohol use: No   Drug use: Not Currently    Types: Marijuana   Sexual activity: Yes    Birth control/protection: None  Other Topics Concern   Not on file  Social History Narrative   Not on file   Social Determinants of Health   Financial Resource Strain: Not on file  Food Insecurity: No Food Insecurity   Worried About Running Out of Food in the Last Year: Never true   Ran Out of Food in the Last Year: Never true  Transportation Needs: No Transportation Needs   Lack of Transportation (Medical): No   Lack of Transportation (Non-Medical): No  Physical Activity:  Not on file  Stress: Not on file  Social Connections: Not on file    Family History: Family History  Problem Relation Age of Onset   Cancer Father    Colon cancer Father     Allergies: No Known Allergies  Medications Prior to Admission  Medication Sig Dispense Refill Last Dose   albuterol (VENTOLIN HFA) 108 (90 Base) MCG/ACT inhaler 1-2 puff as needed   04/26/2021   ferrous sulfate 325 (65 FE) MG tablet Take 1 tablet (325 mg total) by mouth every other day. 90 tablet 3 04/26/2021   prenatal vitamin w/FE, FA (PRENATAL 1 + 1) 27-1 MG TABS tablet Take 1 tablet by mouth daily at 12 noon. 30 tablet 11 04/26/2021   valACYclovir (VALTREX) 1000 MG tablet Take 1 tablet (1,000 mg total) by mouth daily. 60 tablet 1 04/26/2021   Blood Pressure Monitoring DEVI 1 Device by Does not apply route once a week. OCD 10 ; Z34.90 (Patient not taking: No sig reported) 1 Device 0    ferrous sulfate 325 (65 FE) MG tablet Take 1 tablet (325 mg total) by mouth daily with breakfast.  60 tablet 0    PROAIR HFA 108 (90 Base) MCG/ACT inhaler SMARTSIG:2 Puff(s) By Mouth Every 6 Hours PRN (Patient not taking: No sig reported)      valACYclovir (VALTREX) 1000 MG tablet Take 1 tablet (1,000 mg total) by mouth daily. 5 tablet PRN     Review of Systems  All systems reviewed and negative except as stated in HPI  Last menstrual period 07/22/2020, unknown if currently breastfeeding. General appearance: alert, cooperative, and mild distress Lungs: clear to auscultation bilaterally Heart: regular rate and rhythm Abdomen: soft, non-tender; bowel sounds normal Extremities: Homans sign is negative, no sign of DVT Presentation: cephalic Fetal monitoring: Baseline: 125 bpm, Variability: Good {> 6 bpm), Accelerations: Reactive, and Decelerations: Absent Uterine activity: Frequency: Every 3-5 minutes Dilation: 4 Effacement (%): 70 Exam by:: E.Edwards,RN   Prenatal labs: ABO, Rh: A/Positive/-- (04/26 1449) Antibody: Negative  (04/26 1449) Rubella: 3.14 (04/26 1449) RPR: Non Reactive (09/08 0937)  HBsAg: Negative (04/26 1449)  HIV: Non Reactive (09/08 0937)  GBS: Negative/-- (10/17 1558)  GTT:  3rd trimester 82, 74, 67 (normal) Genetic screening: low risk NIPS, AFP neg Anatomy US: normal  Prenatal Transfer Tool  Maternal Diabetes: No Genetic Screening: Normal Maternal Ultrasounds/Referrals: Normal Fetal Ultrasounds or other Referrals:  None Maternal Substance Abuse:  No Significant Maternal Medications:  Meds include: Other: Valtrex Significant Maternal Lab Results: Group B Strep negative  No results found for this or any previous visit (from the past 24 hour(s)).  Patient Active Problem List   Diagnosis Date Noted   Asthma complicating pregnancy, antepartum 11/10/2020   Supervision of low-risk pregnancy, unspecified trimester 10/11/2020   HSV-2 infection complicating pregnancy 04/27/2019   History of chlamydia 04/22/2019    Assessment/Plan:  Rebekah Lloyd is a 25 y.o. G3P2002 at [redacted]w[redacted]d here for SOL.  #Labor: Arrived in spontaneous labor. Will consider AROM at next check but otherwise expectant management at this time. #Pain: Planning for epidural. #Fetal Well Being: Cat I #ID: Group B Strep negative #Method Of Feeding: breast feeding #Method Of Contraception: Desires PP IUD placed, consent signed #Circ: N/A  Reeves Forth, MD PGY-1  GME ATTESTATION:  I saw and evaluated the patient. I agree with the findings and the plan of care as documented in the resident's note and have made all necessary edits.  Warner Mccreedy, MD, MPH OB Fellow, Faculty Practice [facility], [facility] 04/26/2021 11:39 PM

## 2021-04-26 NOTE — Progress Notes (Signed)
Labor Progress Note Rebekah Lloyd is a 25 y.o. G3P2002 at [redacted]w[redacted]d admitted for SOL.  S: Resting comfortably in bed (received epidural). Interested in AROM.  O:  BP 128/76   Pulse 90   Temp 98.3 F (36.8 C) (Oral)   Resp 15   LMP 07/22/2020   SpO2 98%  EFM: baseline 120/moderate variability/accelerations present, no decels present  CVE: Dilation: 4 Effacement (%): 70 Station: -2 Presentation: Vertex Exam by:: Dr. Ozella Rocks No lesions on external genital exam.   A&P: 25 y.o. [id number] [redacted]w[redacted]d admitted for SOL. #Labor: Still at 4 cm with bulging bag. Discussed risks/benefits of AROM and patient desired to do this. Dr. Ephriam Jenkins performed AROM with clear fluid return and good head application. #Pain: Epidural placed #FWB: Category I #GBS negative   Reeves Forth, MD 11:28 PM

## 2021-04-26 NOTE — MAU Note (Signed)
Was here 2 nights ago with ctx's.  Yesterday had bloody show and lost her mucous plug.  Today she has had irreg contractions and brown d/c.  Very concerned about the d/c, has never had that before.  Called advice line and was instructed to come in

## 2021-04-26 NOTE — Anesthesia Procedure Notes (Signed)
Epidural Patient location during procedure: OB Start time: 04/26/2021 10:00 PM End time: 04/26/2021 10:07 PM  Staffing Anesthesiologist: Lannie Fields, DO Performed: anesthesiologist   Preanesthetic Checklist Completed: patient identified, IV checked, risks and benefits discussed, monitors and equipment checked, pre-op evaluation and timeout performed  Epidural Patient position: sitting Prep: DuraPrep and site prepped and draped Patient monitoring: continuous pulse ox, blood pressure, heart rate and cardiac monitor Approach: midline Location: L3-L4 Injection technique: LOR air  Needle:  Needle type: Tuohy  Needle gauge: 17 G Needle length: 9 cm Needle insertion depth: 5 cm Catheter type: closed end flexible Catheter size: 19 Gauge Catheter at skin depth: 10 cm Test dose: negative  Assessment Sensory level: T8 Events: blood not aspirated, injection not painful, no injection resistance, no paresthesia and negative IV test  Additional Notes Patient identified. Risks/Benefits/Options discussed with patient including but not limited to bleeding, infection, nerve damage, paralysis, failed block, incomplete pain control, headache, blood pressure changes, nausea, vomiting, reactions to medication both or allergic, itching and postpartum back pain. Confirmed with bedside nurse the patient's most recent platelet count. Confirmed with patient that they are not currently taking any anticoagulation, have any bleeding history or any family history of bleeding disorders. Patient expressed understanding and wished to proceed. All questions were answered. Sterile technique was used throughout the entire procedure. Please see nursing notes for vital signs. Test dose was given through epidural catheter and negative prior to continuing to dose epidural or start infusion. Warning signs of high block given to the patient including shortness of breath, tingling/numbness in hands, complete motor  block, or any concerning symptoms with instructions to call for help. Patient was given instructions on fall risk and not to get out of bed. All questions and concerns addressed with instructions to call with any issues or inadequate analgesia.  Reason for block:procedure for pain

## 2021-04-26 NOTE — OB Triage Provider Note (Signed)
[id number] Rebekah Lloyd [birth year]  Patient informed that the ultrasound is considered a limited OB ultrasound and is not intended to be a complete ultrasound exam.  Patient also informed that the ultrasound is not being completed with the intent of assessing for fetal or placental anomalies or any pelvic abnormalities.  Explained that the purpose of today's ultrasound is to assess for  presentation.  Patient acknowledges the purpose of the exam and the limitations of the study.  US reveals vertex presentation.   Cherre Robins, CNM 04/26/2021, 6:47 PM

## 2021-04-26 NOTE — Anesthesia Preprocedure Evaluation (Signed)
Anesthesia Evaluation  Patient identified by MRN, date of birth, ID band Patient awake    Reviewed: Allergy & Precautions, Patient's Chart, lab work & pertinent test results  Airway Mallampati: II  TM Distance: >3 FB Neck ROM: Full    Dental no notable dental hx.    Pulmonary asthma ,    Pulmonary exam normal breath sounds clear to auscultation       Cardiovascular negative cardio ROS Normal cardiovascular exam Rhythm:Regular Rate:Normal     Neuro/Psych negative neurological ROS  negative psych ROS   GI/Hepatic negative GI ROS, Neg liver ROS,   Endo/Other  negative endocrine ROS  Renal/GU negative Renal ROS  negative genitourinary   Musculoskeletal negative musculoskeletal ROS (+)   Abdominal   Peds negative pediatric ROS (+)  Hematology  (+) Blood dyscrasia, anemia , hct 30.9, plt 321   Anesthesia Other Findings   Reproductive/Obstetrics (+) Pregnancy 3rd pregnancy, 2 prior epidurals- last one w/ partial effectiveness d/t back labor                             Anesthesia Physical Anesthesia Plan  ASA: 2  Anesthesia Plan: Epidural   Post-op Pain Management:    Induction:   PONV Risk Score and Plan: 2  Airway Management Planned: Natural Airway  Additional Equipment: None  Intra-op Plan:   Post-operative Plan:   Informed Consent: I have reviewed the patients History and Physical, chart, labs and discussed the procedure including the risks, benefits and alternatives for the proposed anesthesia with the patient or authorized representative who has indicated his/her understanding and acceptance.       Plan Discussed with:   Anesthesia Plan Comments:         Anesthesia Quick Evaluation

## 2021-04-27 ENCOUNTER — Encounter (HOSPITAL_COMMUNITY): Payer: Self-pay | Admitting: Obstetrics & Gynecology

## 2021-04-27 DIAGNOSIS — O9832 Other infections with a predominantly sexual mode of transmission complicating childbirth: Secondary | ICD-10-CM

## 2021-04-27 DIAGNOSIS — Z975 Presence of (intrauterine) contraceptive device: Secondary | ICD-10-CM

## 2021-04-27 DIAGNOSIS — Z30014 Encounter for initial prescription of intrauterine contraceptive device: Secondary | ICD-10-CM

## 2021-04-27 DIAGNOSIS — Z3A39 39 weeks gestation of pregnancy: Secondary | ICD-10-CM

## 2021-04-27 LAB — RPR: RPR Ser Ql: NONREACTIVE

## 2021-04-27 MED ORDER — MEDROXYPROGESTERONE ACETATE 150 MG/ML IM SUSP: 150.0000 mg | INTRAMUSCULAR | Status: DC | PRN

## 2021-04-27 MED ORDER — WITCH HAZEL-GLYCERIN EX PADS: 1.0000 "application " | MEDICATED_PAD | CUTANEOUS | Status: DC | PRN

## 2021-04-27 MED ORDER — ONDANSETRON HCL 4 MG/2ML IJ SOLN: 4.0000 mg | INTRAMUSCULAR | Status: DC | PRN

## 2021-04-27 MED ORDER — FERROUS SULFATE 325 (65 FE) MG PO TABS: 325.0000 mg | ORAL_TABLET | ORAL | Status: DC

## 2021-04-27 MED ORDER — FENTANYL CITRATE (PF) 100 MCG/2ML IJ SOLN
INTRAMUSCULAR | Status: AC
  Filled 2021-04-27: qty 2

## 2021-04-27 MED ORDER — SENNOSIDES-DOCUSATE SODIUM 8.6-50 MG PO TABS
2.0000 | ORAL_TABLET | Freq: Every day | ORAL | Status: DC
  Administered 2021-04-28: 2 via ORAL
  Filled 2021-04-27: qty 2

## 2021-04-27 MED ORDER — ACETAMINOPHEN 325 MG PO TABS
650.0000 mg | ORAL_TABLET | ORAL | Status: DC | PRN
  Administered 2021-04-27: 650 mg via ORAL
  Filled 2021-04-27: qty 2

## 2021-04-27 MED ORDER — BENZOCAINE-MENTHOL 20-0.5 % EX AERO: 1.0000 "application " | INHALATION_SPRAY | CUTANEOUS | Status: DC | PRN

## 2021-04-27 MED ORDER — IBUPROFEN 600 MG PO TABS
600.0000 mg | ORAL_TABLET | Freq: Four times a day (QID) | ORAL | Status: DC
  Administered 2021-04-27 – 2021-04-28 (×5): 600 mg via ORAL
  Filled 2021-04-27 (×5): qty 1

## 2021-04-27 MED ORDER — LEVONORGESTREL 20.1 MCG/DAY IU IUD
1.0000 | INTRAUTERINE_SYSTEM | Freq: Once | INTRAUTERINE | Status: AC
  Administered 2021-04-27: 1 via INTRAUTERINE
  Filled 2021-04-27: qty 1

## 2021-04-27 MED ORDER — FENTANYL CITRATE (PF) 100 MCG/2ML IJ SOLN
INTRAMUSCULAR | Status: DC | PRN
  Administered 2021-04-27: 100 ug via EPIDURAL

## 2021-04-27 MED ORDER — SIMETHICONE 80 MG PO CHEW: 80.0000 mg | CHEWABLE_TABLET | ORAL | Status: DC | PRN

## 2021-04-27 MED ORDER — TETANUS-DIPHTH-ACELL PERTUSSIS 5-2.5-18.5 LF-MCG/0.5 IM SUSY: 0.5000 mL | PREFILLED_SYRINGE | Freq: Once | INTRAMUSCULAR | Status: DC

## 2021-04-27 MED ORDER — MEASLES, MUMPS & RUBELLA VAC IJ SOLR: 0.5000 mL | Freq: Once | INTRAMUSCULAR | Status: DC

## 2021-04-27 MED ORDER — DIPHENHYDRAMINE HCL 25 MG PO CAPS: 25.0000 mg | ORAL_CAPSULE | Freq: Four times a day (QID) | ORAL | Status: DC | PRN

## 2021-04-27 MED ORDER — COCONUT OIL OIL: 1.0000 "application " | TOPICAL_OIL | Status: DC | PRN

## 2021-04-27 MED ORDER — ONDANSETRON HCL 4 MG PO TABS: 4.0000 mg | ORAL_TABLET | ORAL | Status: DC | PRN

## 2021-04-27 MED ORDER — BUPIVACAINE HCL (PF) 0.25 % IJ SOLN
INTRAMUSCULAR | Status: DC | PRN
  Administered 2021-04-27: 8 mL via EPIDURAL

## 2021-04-27 MED ORDER — METHYLERGONOVINE MALEATE 0.2 MG/ML IJ SOLN
INTRAMUSCULAR | Status: AC
  Filled 2021-04-27: qty 1

## 2021-04-27 MED ORDER — METHYLERGONOVINE MALEATE 0.2 MG/ML IJ SOLN
INTRAMUSCULAR | Status: AC
  Administered 2021-04-27: 0.2 mg
  Filled 2021-04-27: qty 1

## 2021-04-27 MED ORDER — PRENATAL MULTIVITAMIN CH
1.0000 | ORAL_TABLET | Freq: Every day | ORAL | Status: DC
  Administered 2021-04-27 – 2021-04-28 (×2): 1 via ORAL
  Filled 2021-04-27 (×2): qty 1

## 2021-04-27 MED ORDER — DIBUCAINE (PERIANAL) 1 % EX OINT: 1.0000 "application " | TOPICAL_OINTMENT | CUTANEOUS | Status: DC | PRN

## 2021-04-27 NOTE — Anesthesia Postprocedure Evaluation (Signed)
Anesthesia Post Note  Patient: Rebekah Lloyd  Procedure(s) Performed: AN AD HOC LABOR EPIDURAL     Patient location during evaluation: Mother Baby Anesthesia Type: Epidural Level of consciousness: awake, oriented, awake and alert and patient cooperative Pain management: pain level controlled Vital Signs Assessment: post-procedure vital signs reviewed and stable Respiratory status: spontaneous breathing, respiratory function stable and nonlabored ventilation Cardiovascular status: blood pressure returned to baseline and stable Postop Assessment: no headache, no backache, patient able to bend at knees, able to ambulate, adequate PO intake and no apparent nausea or vomiting Anesthetic complications: no   No notable events documented.  Last Vitals:  Vitals:   04/27/21 1211 04/27/21 1528  BP: 112/71 115/73  Pulse: 88 76  Resp: 15 16  Temp: 36.7 C 36.7 C  SpO2: 98% 100%    Last Pain:  Vitals:   04/27/21 1528  TempSrc: Oral  PainSc: 0-No pain   Pain Goal:                Epidural/Spinal Function Cutaneous sensation: Normal sensation (04/27/21 1528), Patient able to flex knees: Yes (04/27/21 1528), Patient able to lift hips off bed: Yes (04/27/21 1528), Back pain beyond tenderness at insertion site: No (04/27/21 1528), Progressively worsening motor and/or sensory loss: No (04/27/21 1528), Bowel and/or bladder incontinence post epidural: No (04/27/21 1528)  Ella Golomb

## 2021-04-27 NOTE — Discharge Summary (Signed)
Postpartum Discharge Summary  Date of Service updated 04/28/21     Patient Name: Rebekah Lloyd DOB: [birth year] MRN: [id number]  Date of admission: 04/26/2021 Delivery date:04/27/2021  Delivering provider: Renard Matter  Date of discharge: 04/28/2021  Admitting diagnosis: Indication for care in labor and delivery, antepartum [O75.9] Intrauterine pregnancy: [redacted]w[redacted]d    Secondary diagnosis:  Active Problems:   HSV-2 infection complicating pregnancy   Supervision of low-risk pregnancy, unspecified trimester   Asthma complicating pregnancy, antepartum   Indication for care in labor and delivery, antepartum   Vaginal delivery   IUD (intrauterine device) in place  Additional problems: None    Discharge diagnosis: Term Pregnancy Delivered                                              Post partum procedures: Postpartum Liletta insertion (see labor and delivery note) Augmentation: AROM Complications: None  Hospital course: Onset of Labor With Vaginal Delivery      25y.o. yo [id number] admitted in Active Labor on 04/26/2021. Patient had an uncomplicated labor course as follows:  Membrane Rupture Time/Date: 11:19 PM ,04/26/2021   Delivery Method:Vaginal, Spontaneous  Episiotomy: None  Lacerations:  None  Patient had an uncomplicated postpartum course.  She is ambulating, tolerating a regular diet, passing flatus, and urinating well. Patient is discharged home in stable condition on 04/28/21.  Newborn Data: Birth date:04/27/2021  Birth time:3:53 AM  Gender:Female  Living status:Living  Apgars:9 ,9  Weight:3140 g   Magnesium Sulfate received: No BMZ received: No Rhophylac:N/A MMR:N/A T-DaP:Given postpartum Flu: N/A Transfusion:No  Physical exam  Vitals:   04/27/21 1211 04/27/21 1528 04/27/21 1930 04/28/21 0645  BP: 112/71 115/73 121/78 119/80  Pulse: 88 76 76 84  Resp: '15 16 17 17  ' Temp: 98 F (36.7 C) 98 F (36.7 C) 97.9 F (36.6 C) 98 F (36.7 C)   TempSrc: Oral Oral Oral Oral  SpO2: 98% 100% 99% 100%   General: alert, cooperative, and no distress Lochia: appropriate Uterine Fundus: firm Incision: N/A DVT Evaluation: No evidence of DVT seen on physical exam. Negative Homan's sign. No cords or calf tenderness. No significant calf/ankle edema.  Labs: Lab Results  Component Value Date   WBC 8.3 04/26/2021   HGB 9.2 (L) 04/26/2021   HCT 30.9 (L) 04/26/2021   MCV 84.7 04/26/2021   PLT 321 04/26/2021   No flowsheet data found. Edinburgh Score: Edinburgh Postnatal Depression Scale Screening Tool 09/28/2019  I have been able to laugh and see the funny side of things. 0  I have looked forward with enjoyment to things. 0  I have blamed myself unnecessarily when things went wrong. 0  I have been anxious or worried for no good reason. 0  I have felt scared or panicky for no good reason. 0  Things have been getting on top of me. 0  I have been so unhappy that I have had difficulty sleeping. 0  I have felt sad or miserable. 0  I have been so unhappy that I have been crying. 0  The thought of harming myself has occurred to me. 0  Edinburgh Postnatal Depression Scale Total 0     After visit meds:  Allergies as of 04/28/2021   No Known Allergies      Medication List     TAKE these  medications    acetaminophen 325 MG tablet Commonly known as: Tylenol Take 2 tablets (650 mg total) by mouth every 4 (four) hours as needed (for pain scale < 4).   albuterol 108 (90 Base) MCG/ACT inhaler Commonly known as: VENTOLIN HFA 1-2 puff as needed What changed: Another medication with the same name was removed. Continue taking this medication, and follow the directions you see here.   benzocaine-Menthol 20-0.5 % Aero Commonly known as: DERMOPLAST Apply 1 application topically as needed for irritation (perineal discomfort).   Blood Pressure Monitoring Devi 1 Device by Does not apply route once a week. OCD 10 ; Z34.90   ferrous  sulfate 325 (65 FE) MG tablet Take 1 tablet (325 mg total) by mouth every other day. What changed: Another medication with the same name was removed. Continue taking this medication, and follow the directions you see here.   ibuprofen 600 MG tablet Commonly known as: ADVIL Take 1 tablet (600 mg total) by mouth every 6 (six) hours.   prenatal vitamin w/FE, FA 27-1 MG Tabs tablet Take 1 tablet by mouth daily at 12 noon.   senna-docusate 8.6-50 MG tablet Commonly known as: Senokot-S Take 2 tablets by mouth at bedtime as needed for mild constipation.   simethicone 80 MG chewable tablet Commonly known as: MYLICON Chew 1 tablet (80 mg total) by mouth as needed for flatulence.   valACYclovir 1000 MG tablet Commonly known as: Valtrex Take 1 tablet (1,000 mg total) by mouth daily.   witch hazel-glycerin pad Commonly known as: TUCKS Apply 1 application topically as needed for hemorrhoids.         Discharge home in stable condition Infant Feeding: Breast Infant Disposition:home with mother Discharge instruction: per After Visit Summary and Postpartum booklet. Activity: Advance as tolerated. Pelvic rest for 6 weeks.  Diet: routine diet Future Appointments: Future Appointments  Date Time Provider Viola  05/25/2021  2:15 PM Gabriel Carina, CNM [facility]   Follow up Visit:  Follow-up Information     Gabriel Carina, CNM Follow up on 05/25/2021.   Specialty: Certified Nurse Midwife Why: at 2:15 pm Contact information: [place]  [phone]                Message sent to [facility] by Dr. Cy Blamer on 11/10  Please schedule this patient for a In person postpartum visit in 6 weeks with the following provider: Any provider. Additional Postpartum F/U: IUD string check   Low risk pregnancy complicated by:  None Delivery mode:  Vaginal, Spontaneous  Anticipated Birth Control:  PP IUD placed   04/28/2021 Radene Gunning, MD

## 2021-04-27 NOTE — Lactation Note (Signed)
This note was copied from a baby's chart. Lactation Consultation Note  Patient Name: Rebekah Lloyd Today's Date: 04/27/2021 Reason for consult: Initial assessment;Term;Primapara;1st time breastfeeding Age:23 hours   P3 mother whose infant is now 75 hours old.  This is a term baby at 39+6 weeks.  Baby was asleep STS on mother's chest when I arrived.  Reviewed breast feeding basics stressing the importance of observing for feeding cues.  Mother will feed 8-12 times/24 hours or sooner if baby shows cues.  Suggested she call her RN/LC for latch assistance as needed.  Mother is able to hand express colostrum.  Discussed finger feeding and/or spoon feeding any EBM she obtains.    Mom made aware of O/P services, breastfeeding support groups, community resources, and our phone # for post-discharge questions.  Mother has a DEBP for home use.  Father present and asleep on the couch.   Maternal Data Has patient been taught Hand Expression?: Yes Does the patient have breastfeeding experience prior to this delivery?: No  Feeding Mother's Current Feeding Choice: Breast Milk  LATCH Score Latch: Too sleepy or reluctant, no latch achieved, no sucking elicited.  Audible Swallowing: None  Type of Nipple: Everted at rest and after stimulation  Comfort (Breast/Nipple): Soft / non-tender  Hold (Positioning): Assistance needed to correctly position infant at breast and maintain latch.  LATCH Score: 5   Lactation Tools Discussed/Used    Interventions Interventions: Breast feeding basics reviewed;Education  Discharge Pump: Personal  Consult Status Consult Status: Follow-up Date: 04/28/21 Follow-up type: In-patient    Dora Sims 04/27/2021, 7:43 AM

## 2021-04-27 NOTE — Lactation Note (Signed)
This note was copied from a baby's chart. Lactation Consultation Note  Patient Name: Girl Tashae Inda [id number] Date: 04/27/2021 Reason for consult: L&D Initial assessment;Term Age:23 hours   Initial L&D Consult:  Visited with family < 1 hour after birth Baby was awake and STS on mother's chest.  Attempted to latch, however, baby not interested in opening her mouth; no colostrum expressed.  Reassured mother that lactation services will be available on the M/B unit.  Allowed time for family bonding.  Father present.   Maternal Data    Feeding Mother's Current Feeding Choice: Breast Milk  LATCH Score Latch: Too sleepy or reluctant, no latch achieved, no sucking elicited.  Audible Swallowing: None  Type of Nipple: Everted at rest and after stimulation  Comfort (Breast/Nipple): Soft / non-tender  Hold (Positioning): Assistance needed to correctly position infant at breast and maintain latch.  LATCH Score: 5   Lactation Tools Discussed/Used    Interventions Interventions: Assisted with latch;Skin to skin  Discharge    Consult Status Consult Status: Follow-up from L&D    Marijane Trower R Calena Salem 04/27/2021, 4:27 AM

## 2021-04-28 ENCOUNTER — Encounter: Payer: Medicaid Other | Admitting: Obstetrics & Gynecology

## 2021-04-28 ENCOUNTER — Other Ambulatory Visit (HOSPITAL_COMMUNITY): Payer: Self-pay

## 2021-04-28 MED ORDER — SENNOSIDES-DOCUSATE SODIUM 8.6-50 MG PO TABS
2.0000 | ORAL_TABLET | Freq: Every evening | ORAL | 0 refills | Status: AC | PRN
  Filled 2021-04-28: qty 60, 30d supply, fill #0

## 2021-04-28 MED ORDER — ACETAMINOPHEN 325 MG PO TABS
650.0000 mg | ORAL_TABLET | ORAL | 0 refills | Status: DC | PRN
  Filled 2021-04-28: qty 30, 3d supply, fill #0

## 2021-04-28 MED ORDER — IBUPROFEN 600 MG PO TABS
600.0000 mg | ORAL_TABLET | Freq: Four times a day (QID) | ORAL | 0 refills | Status: DC
  Filled 2021-04-28: qty 30, 8d supply, fill #0

## 2021-04-28 MED ORDER — WITCH HAZEL-GLYCERIN EX PADS
1.0000 "application " | MEDICATED_PAD | CUTANEOUS | 12 refills | Status: DC | PRN
  Filled 2021-04-28: qty 40, 10d supply, fill #0

## 2021-04-28 MED ORDER — BENZOCAINE-MENTHOL 20-0.5 % EX AERO
1.0000 "application " | INHALATION_SPRAY | CUTANEOUS | 0 refills | Status: DC | PRN
  Filled 2021-04-28: qty 56, fill #0

## 2021-04-28 MED ORDER — SIMETHICONE 80 MG PO CHEW
80.0000 mg | CHEWABLE_TABLET | ORAL | 0 refills | Status: DC | PRN
Start: 2021-04-28 — End: 2022-10-17
  Filled 2021-04-28: qty 30, 10d supply, fill #0

## 2021-05-01 ENCOUNTER — Telehealth: Payer: Self-pay

## 2021-05-01 NOTE — Telephone Encounter (Signed)
Transition Care Management Follow-up Telephone Call Date of discharge and from where: 04/28/2021-[facility] How have you been since you were released from the hospital? Patient stated everything is wonderful.  Any questions or concerns? No  Items Reviewed: Did the pt receive and understand the discharge instructions provided? Yes  Medications obtained and verified? Yes  Other? No  Any new allergies since your discharge? No  Dietary orders reviewed? No Do you have support at home? Yes   Home Care and Equipment/Supplies: Were home health services ordered? not applicable If so, what is the name of the agency? N/A  Has the agency set up a time to come to the patient's home? not applicable Were any new equipment or medical supplies ordered?  No What is the name of the medical supply agency? N/A Were you able to get the supplies/equipment? not applicable Do you have any questions related to the use of the equipment or supplies? No  Functional Questionnaire: (I = Independent and D = Dependent) ADLs: I  Bathing/Dressing- I  Meal Prep- I  Eating- I  Maintaining continence- I  Transferring/Ambulation- I  Managing Meds- I  Follow up appointments reviewed:  PCP Hospital f/u appt confirmed? No   Specialist Hospital f/u appt confirmed? Yes  Scheduled to see OBGYN on 05/25/2021 @ 2:15 PM. Are transportation arrangements needed? No  If their condition worsens, is the pt aware to call PCP or go to the Emergency Dept.? Yes Was the patient provided with contact information for the PCP's office or ED? Yes Was to pt encouraged to call back with questions or concerns? Yes

## 2021-05-02 ENCOUNTER — Other Ambulatory Visit (HOSPITAL_COMMUNITY): Payer: Self-pay

## 2021-05-08 ENCOUNTER — Encounter: Payer: Self-pay | Admitting: Certified Nurse Midwife

## 2021-05-08 ENCOUNTER — Other Ambulatory Visit: Payer: Self-pay

## 2021-05-08 ENCOUNTER — Ambulatory Visit (INDEPENDENT_AMBULATORY_CARE_PROVIDER_SITE_OTHER): Payer: Medicaid Other | Admitting: Family Medicine

## 2021-05-08 ENCOUNTER — Telehealth: Payer: Self-pay | Admitting: General Practice

## 2021-05-08 ENCOUNTER — Encounter: Payer: Self-pay | Admitting: Family Medicine

## 2021-05-08 VITALS — Ht 67.0 in | Wt 155.1 lb

## 2021-05-08 DIAGNOSIS — Z30431 Encounter for routine checking of intrauterine contraceptive device: Secondary | ICD-10-CM | POA: Diagnosis not present

## 2021-05-08 NOTE — Telephone Encounter (Signed)
Called patient regarding mychart message and offered work in appointment for this morning if patient could leave now. Patient verbalized understanding & stated she was on her way.

## 2021-05-08 NOTE — Progress Notes (Signed)
     Rebekah Lloyd is a 25 y.o. [id number] female who presents for an IUD string issue. She is  11  days postpartum following a normal spontaneous vaginal delivery. Postpartum course has been normal. Baby is doing well. Baby is feeding by  bottle fed, breast milk . Bleeding moderate lochia. Bowel function is normal. Bladder function is normal. Patient is not sexually active. Contraception method is IUD. Placed post-placenatally and notes strings are hanging out. Postpartum depression screening: negative.   The pregnancy intention screening data noted above was reviewed. Potential methods of contraception were discussed. The patient elected to proceed with No data recorded.   Edinburgh Postnatal Depression Scale - 05/08/21 1133       Edinburgh Postnatal Depression Scale:  In the Past 7 Days   I have been able to laugh and see the funny side of things. 0    I have looked forward with enjoyment to things. 0    I have blamed myself unnecessarily when things went wrong. 0    I have been anxious or worried for no good reason. 0    I have felt scared or panicky for no good reason. 0    Things have been getting on top of me. 0    I have been so unhappy that I have had difficulty sleeping. 0    I have felt sad or miserable. 0    I have been so unhappy that I have been crying. 0    The thought of harming myself has occurred to me. 0    Edinburgh Postnatal Depression Scale Total 0             Health Maintenance Due  Topic Date Due   Pneumococcal Vaccine 89-7 Years old (1 - PCV) Never done   HPV VACCINES (1 - 2-dose series) Never done    The following portions of the patient's history were reviewed and updated as appropriate: allergies, current medications, past family history, past medical history, past social history, past surgical history, and problem list.  Review of Systems Pertinent items are noted in HPI.  Objective:  Ht 5\' 7"  (1.702 m)   Wt 155 lb 1.6 oz (70.4 kg)   LMP  07/22/2020   Breastfeeding Yes Comment: pumping  BMI 24.29 kg/m    General:  alert, cooperative, and appears stated age  Lungs: Normal effort  Heart:  regular rate and rhythm  Abdomen: soft, non-tender; bowel sounds normal; no masses,  no organomegaly   GU exam:  normal IUD strings protruding, trimmed near the cervix       Assessment:     Plan:   Essential components of care per ACOG recommendations:  1.  Mood and well being: Patient with negative depression screening today.   2. Infant care and feeding:  -Patient currently breastmilk feeding? Yes   3. IUD check up - strings trimmed  4. Offered and declined flu shot    09/19/2020, MD [facility], [facility]

## 2021-05-08 NOTE — Patient Instructions (Signed)
    Dental Resources   [place]   Dr. Carl Little  Exam $85   [address]  Extraction $120 and up   [place], [place]  *full list of prices available*   [phone]      [facility]  Exam $94   [address]  Exam w/ Xrays $380   [place], [place]  Xrays $68 and up   [phone]  Cleaning $101   Extraction $190 and up      [facility]  Cleaning + Xray $344   [address]  Extraction- pt has to be seen first to give price   [place], [place]   [phone]     [place]   Dr. Ben Turner/Dr. Clay Burton  Exam, Cleaning, Xray $262   [address]  Extraction $207-$317   [place]   [phone]      [facility]  Cleaning $5   [address]  Xray $5   [place], [place] [address]  Call to get on waiting list   [phone] ext [phone]     Dr. James McMasters/Dr. Eric Sadler   [address]  Xray $85 Each   [place], [place] [address]  Extraction $200    Dr. Stacey Green  Extraction $300 per tooth   [address]   [place], [place] [address]   [phone]      Dr. Janna Civils  Cleaning $300   [address]  Extraction $273   [place], [place] [address]   [phone]     [place]   [facility]  Emergency Exam $65   [address]  Cleaning & Exam $150   [place], [place] [address]  Extractions: Simple $180 Surgical $250   [phone]  Fillings $150-$225     

## 2021-05-09 ENCOUNTER — Telehealth (HOSPITAL_COMMUNITY): Payer: Self-pay

## 2021-05-09 NOTE — Telephone Encounter (Signed)
No answer. Message left to return nurse call.   Signe Colt 05/09/21,1630

## 2021-05-25 ENCOUNTER — Ambulatory Visit: Payer: Medicaid Other | Admitting: Certified Nurse Midwife

## 2021-05-25 ENCOUNTER — Telehealth: Payer: Self-pay

## 2021-05-25 NOTE — Telephone Encounter (Signed)
Patient left voicemail on nurse line requesting appt be rescheduled. Would prefer a morning appt.

## 2021-06-26 ENCOUNTER — Ambulatory Visit: Payer: Medicaid Other | Admitting: Obstetrics and Gynecology

## 2021-06-26 ENCOUNTER — Encounter: Payer: Self-pay | Admitting: Student

## 2021-06-27 IMAGING — US US FETAL BPP W/ NON-STRESS
1 series · 13 of 15 positions shown · non-contrast
Comparison: none

[Series 1: us fetal bpp w/nonstress · 15 acquisitions, 13 frames shown]
[im 1/15]
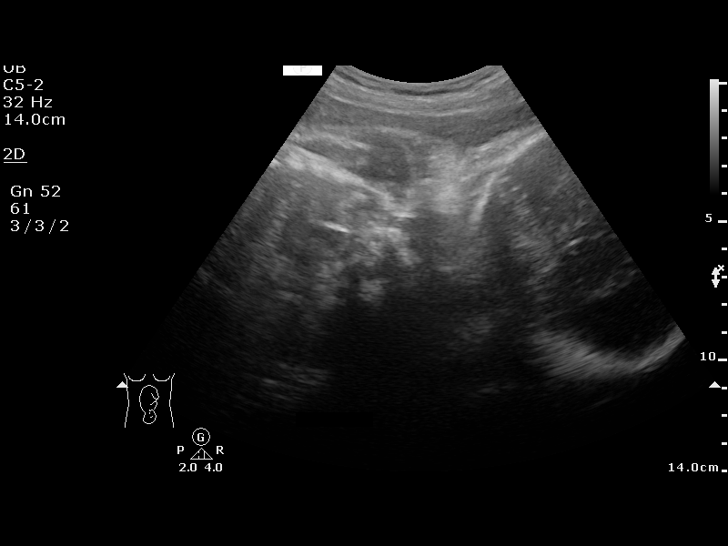
[im 2/15]
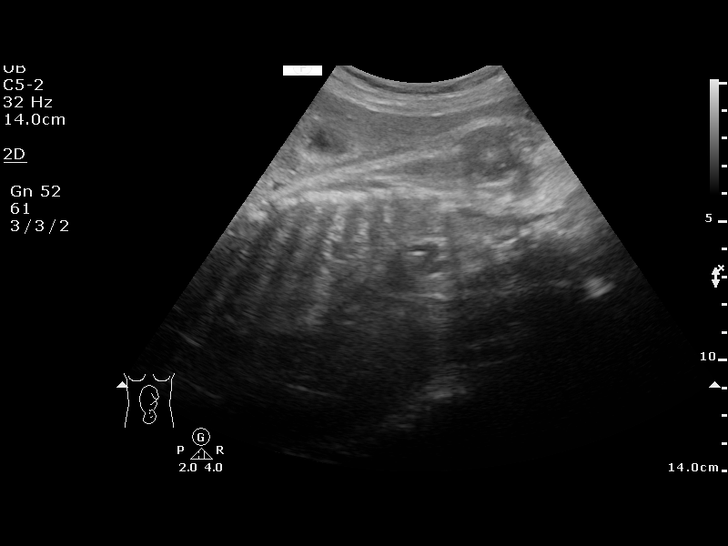
[im 3/15]
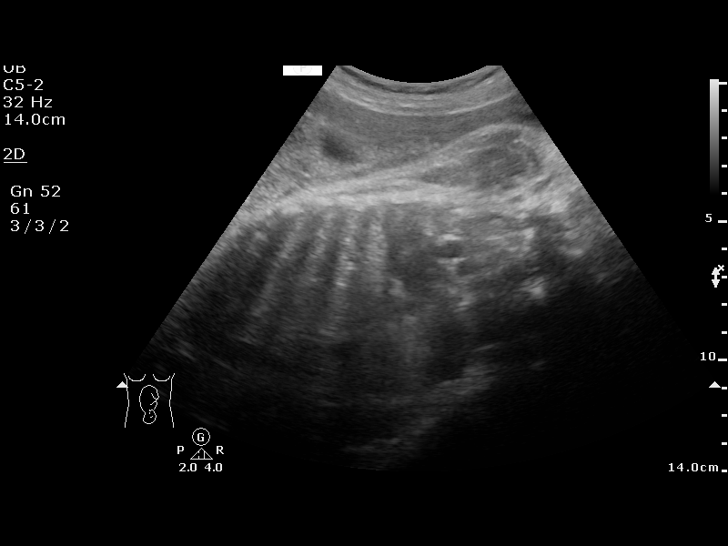
[im 5/15]
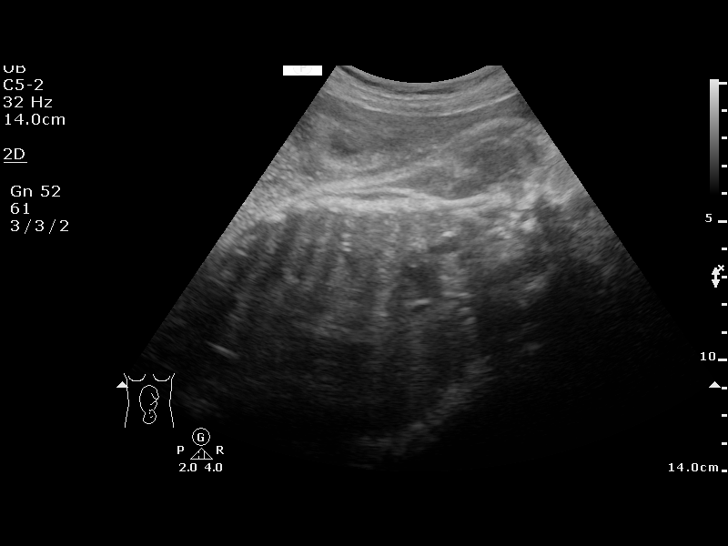
[im 6/15]
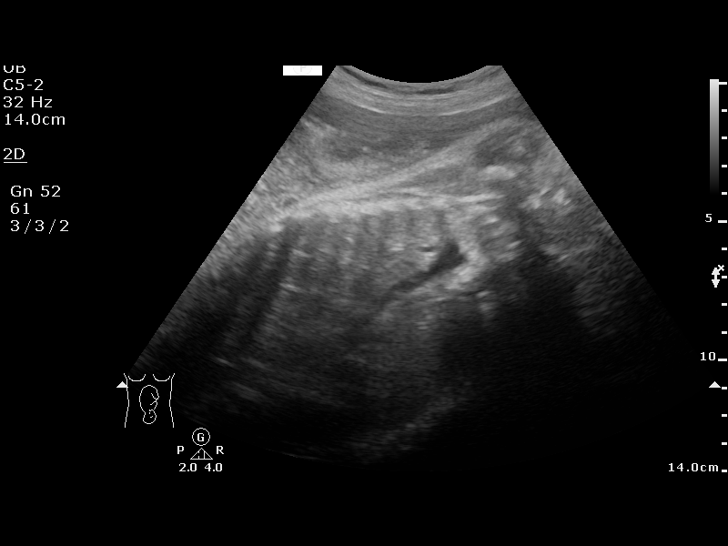
[im 7/15]
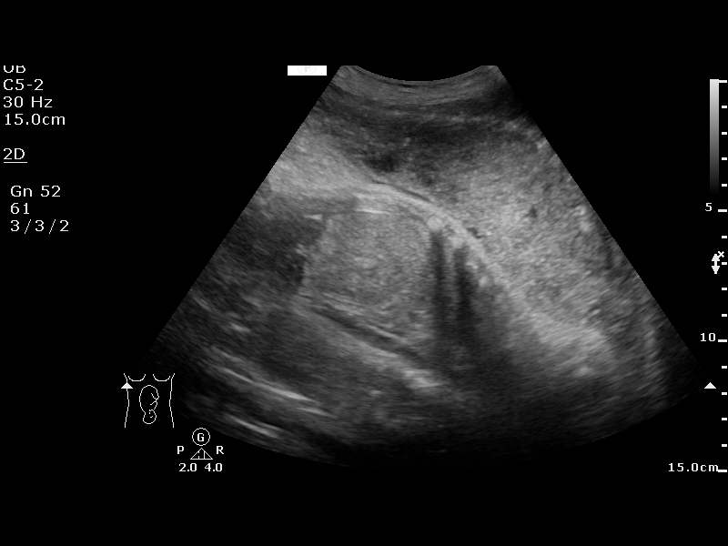
[im 8/15]
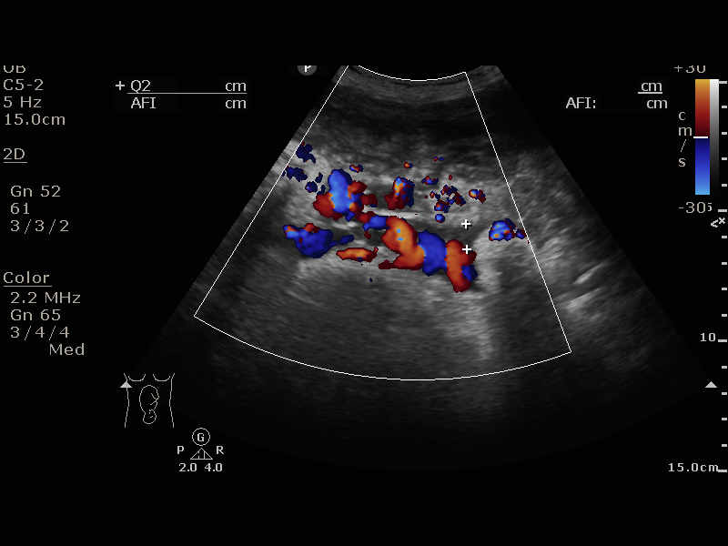
[im 9/15]
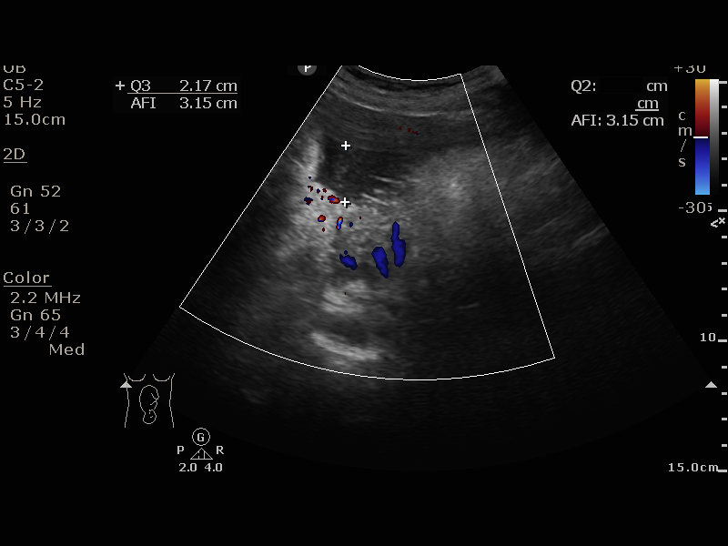
[im 10/15]
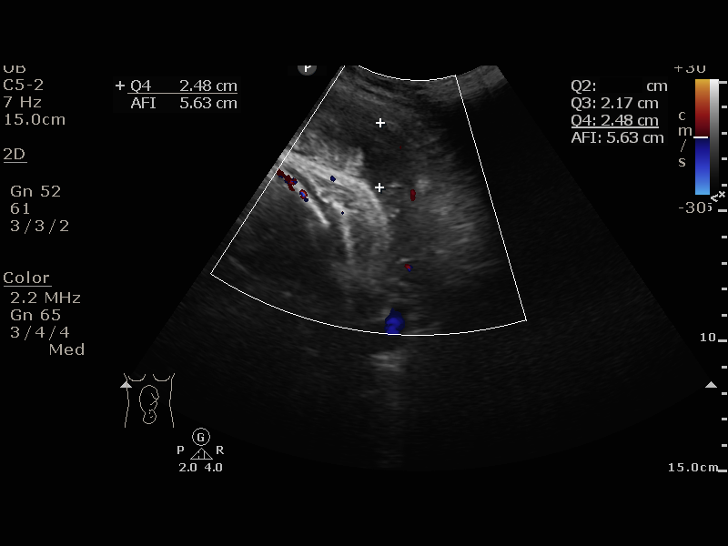
[im 11/15]
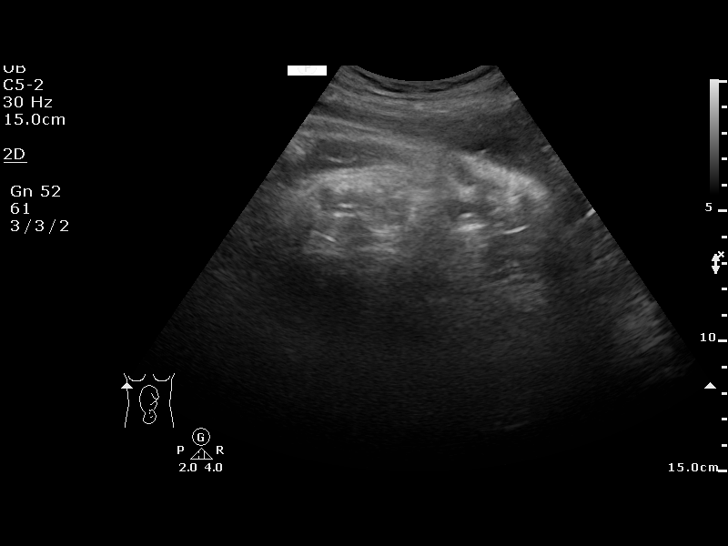
[im 13/15]
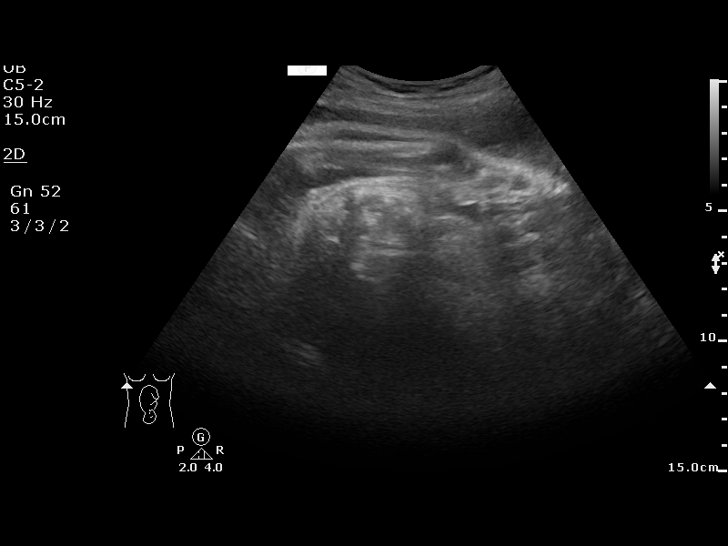
[im 14/15]
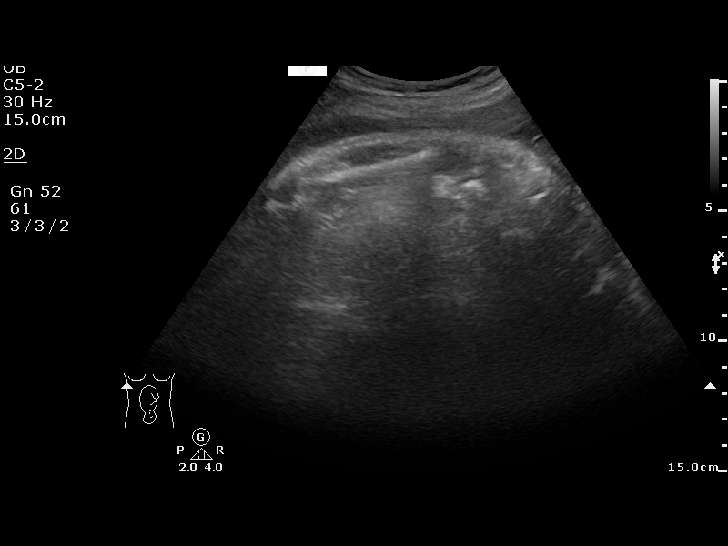
[im 15/15]
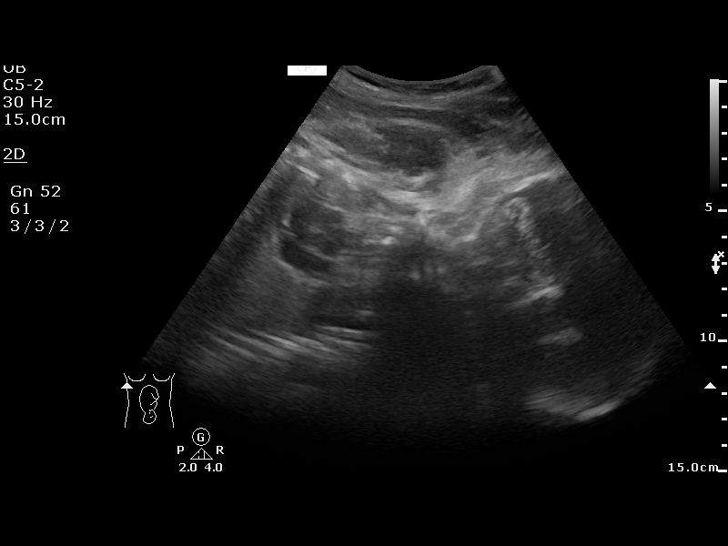

[13 of 15 positions shown; findings below may reference images not displayed]

[address]
                                                            Women's
                                                            [REDACTED]

  1  US FETAL BPP W/NONSTRESS             76818.4      ELHADRI DIGO
 ----------------------------------------------------------------------

 ----------------------------------------------------------------------
Service(s) Provided

 ----------------------------------------------------------------------
Indications

  40 weeks gestation of pregnancy
  Postdate pregnancy (40-42 weeks)
 ----------------------------------------------------------------------
Fetal Evaluation

 Num Of Fetuses:         1
 Preg. Location:         Intrauterine
 Cardiac Activity:       Observed
 Presentation:           Cephalic

 Amniotic Fluid
 AFI FV:      Subjectively low-normal

 AFI Sum(cm)     %Tile       Largest Pocket(cm)
 5.63            < 3

 RUQ(cm)       RLQ(cm)       LUQ(cm)        LLQ(cm)
 0
Biophysical Evaluation

 Amniotic F.V:   Pocket => 2 cm             F. Tone:        Observed
 F. Movement:    Observed                   N.S.T:          Reactive
 F. Breathing:   Observed                   Score:          [DATE]
OB History

 Gravidity:    2         Term:   1
 Living:       1
Gestational Age

 LMP:           40w 2d        Date:  11/17/18                 EDD:   08/24/19
 Best:          40w 2d     Det. By:  LMP  (11/17/18)          EDD:   08/24/19
Impression

 ?oligo past EDC
Recommendations

 Patient seen by Dr. Umarani already who recommends IOL
                [place], [place]

## 2022-08-05 ENCOUNTER — Encounter (HOSPITAL_COMMUNITY): Payer: Self-pay | Admitting: *Deleted

## 2022-08-05 ENCOUNTER — Ambulatory Visit (HOSPITAL_COMMUNITY)
Admission: EM | Admit: 2022-08-05 | Discharge: 2022-08-05 | Disposition: A | Discharge status: Home / Self Care | Payer: Medicaid Other | Attending: Physician Assistant | Admitting: Physician Assistant

## 2022-08-05 DIAGNOSIS — H6503 Acute serous otitis media, bilateral: Secondary | ICD-10-CM

## 2022-08-05 DIAGNOSIS — H1033 Unspecified acute conjunctivitis, bilateral: Secondary | ICD-10-CM

## 2022-08-05 DIAGNOSIS — J069 Acute upper respiratory infection, unspecified: Secondary | ICD-10-CM | POA: Diagnosis not present

## 2022-08-05 MED ORDER — AMOXICILLIN 500 MG PO CAPS: 500.0000 mg | ORAL_CAPSULE | Freq: Three times a day (TID) | ORAL | 0 refills | Status: DC

## 2022-08-05 MED ORDER — POLYMYXIN B-TRIMETHOPRIM 10000-0.1 UNIT/ML-% OP SOLN: 1.0000 [drp] | Freq: Four times a day (QID) | OPHTHALMIC | 0 refills | Status: DC

## 2022-08-05 MED ORDER — PSEUDOEPHEDRINE HCL 30 MG PO TABS
30.0000 mg | ORAL_TABLET | Freq: Two times a day (BID) | ORAL | 0 refills | Status: DC
Start: 2022-08-05 — End: 2022-10-17

## 2022-08-05 NOTE — Discharge Instructions (Signed)
Advised to take the Sudafed every 12 hours to help decrease congestion. Advised to take the amoxicillin 500 mg every 8 hours to treat the ear infection. Advised use of Polytrim eyedrops, 1 drop each eye 4 times a day to help treat the eye infection. Advised take Tylenol or Motrin as needed for body aches pains and discomfort.  Advised follow-up PCP or return to urgent care as needed.

## 2022-08-05 NOTE — ED Provider Notes (Signed)
[place]    CSN: [id number] Arrival date & time: 08/05/22  1012      History   Chief Complaint Chief Complaint  Patient presents with   Eye Problem    HPI Rebekah Lloyd is a 27 y.o. female.   27 year old female presents with bilateral eye drainage, left ear pain, congestion.  Patient indicates for the past several days she has been having some upper respiratory congestion with rhinitis and postnasal drip mainly been clear.  She indicates she has been having left ear pain and pressure over the past several days but it is worse this morning.  She also indicates having bilateral eye drainage which started Wednesday with the left eye and then migrated to the right.  She relates the whites of the eyes are red.  She indicates she wakes up in the morning with the eyes matted shut to where she has to clear the matter out.  She indicates she that she does have some intermittent blurred vision until the matter is cleared, minimal eye pain and discomfort, no history of trauma.  She indicates she has not been around by with pinkeye that she knows of.  She is not running fever or chills.  She is without wheezing or shortness of breath.   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 Authorizing Provider   amoxicillin (AMOXIL) 500 MG capsule Take 1 capsule (500 mg total) by mouth 3 (three) times daily. 08/05/22  Yes Nyoka Lint, PA-C  pseudoephedrine (SUDAFED) 30 MG tablet Take 1 tablet (30 mg total) by mouth 2 (two) times daily. 08/05/22  Yes Nyoka Lint, PA-C  trimethoprim-polymyxin b (POLYTRIM) ophthalmic solution Place 1 drop into both eyes every 6 (six) hours. 08/05/22  Yes Nyoka Lint, PA-C  acetaminophen (TYLENOL) 325 MG tablet Take 2 tablets (650 mg total) by mouth every 4 (four) hours as needed (for pain scale < 4). Patient not taking: Reported on 05/08/2021 04/28/21   Araceli Bouche, MD  albuterol (VENTOLIN HFA) 108 [phone] Base) MCG/ACT inhaler 1-2 puff as needed Patient not taking: Reported on 05/08/2021 09/13/20   [provider]  benzocaine-Menthol (DERMOPLAST) 20-0.5 % AERO Apply 1 application topically as needed for irritation (perineal discomfort). Patient not taking: Reported on 05/08/2021 04/28/21   Araceli Bouche, MD  Blood Pressure Monitoring DEVI 1 Device by Does not apply route once a week. OCD 10 ; Z34.90 Patient not taking: Reported on 03/16/2021 02/10/19   Starr Lake, CNM  ferrous sulfate 325 (65 FE) MG tablet Take 1  tablet (325 mg total) by mouth every other day. Patient not taking: Reported on 05/08/2021 03/12/21   Starr Lake, CNM  ibuprofen (ADVIL) 600 MG tablet Take 1 tablet (600 mg total) by mouth every 6 (six) hours. Patient not taking: Reported on 05/08/2021 04/28/21   Araceli Bouche, MD  prenatal vitamin w/FE, FA (PRENATAL 1 + 1) 27-1 MG TABS tablet Take 1 tablet by mouth daily at 12 noon. 12/30/18   Rasch, Anderson Malta I, NP  simethicone (MYLICON) 80 MG chewable tablet Chew 1 tablet (80 mg total) by mouth as needed for flatulence. Patient not taking: Reported on 05/08/2021 04/28/21   Araceli Bouche, MD  valACYclovir (VALTREX) 1000 MG tablet Take 1 tablet (1,000 mg total) by mouth daily. 04/03/21   Starr Lake, CNM  witch  hazel-glycerin (TUCKS) pad Apply 1 application topically as needed for hemorrhoids. Patient not taking: Reported on 05/08/2021 04/28/21   Araceli Bouche, MD    Family History Family History  Problem Relation Age of Onset   Cancer Father    Colon cancer Father     Social History Social History   Tobacco Use   Smoking status: Never   Smokeless tobacco: Never  Vaping Use   Vaping Use: Never used  Substance Use Topics   Alcohol use: No   Drug use: Yes    Types: Marijuana     Allergies   Patient has no known allergies.   Review of Systems Review of Systems  HENT:  Positive for ear pain (left), postnasal drip and rhinorrhea.   Eyes:  Positive for discharge (bilat) and redness (bilat).  Respiratory:  Positive for cough.      Physical Exam Triage Vital Signs ED Triage Vitals  Enc Vitals Group     BP 08/05/22 1103 123/84     Pulse Rate 08/05/22 1103 75     Resp 08/05/22 1103 16     Temp 08/05/22 1103 98.4 F (36.9 C)     Temp Source 08/05/22 1103 Oral     SpO2 08/05/22 1103 98 %     Weight --      Height --      Head Circumference --      Peak Flow --      Pain Score 08/05/22 1104 0     Pain Loc --      Pain Edu? --      Excl. in [place]? --    No data found.  Updated Vital Signs BP 123/84   Pulse 75   Temp 98.4 F (36.9 C) (Oral)   Resp 16   SpO2 98%   Breastfeeding No   Visual Acuity Right Eye Distance: 20/15 Left Eye Distance: 20/30 Bilateral Distance: 20/15 -1  Right Eye Near:   Left Eye Near:    Bilateral Near:     Physical Exam Constitutional:      Appearance: Normal appearance.  HENT:     Right Ear: Ear canal normal. Tympanic membrane is erythematous.     Left Ear: Ear canal normal. Tympanic membrane is erythematous.     Mouth/Throat:     Mouth: Mucous membranes are moist.     Pharynx: Oropharynx is clear. No posterior oropharyngeal erythema.  Eyes:     General: Lids are normal.        Right eye: Discharge (green) present.        Left  eye: Discharge (green) present.    Extraocular Movements: Extraocular movements intact.     Conjunctiva/sclera:  Right eye: Right conjunctiva is injected. Exudate present.     Left eye: Left conjunctiva is injected. Exudate present.     Comments: Bilateral eyes: Conjunctiva are injected bilaterally, mild purulent drainage present bilaterally, no lid swelling upper or lower bilaterally.  PERRLA  Lymphadenopathy:     Cervical: No cervical adenopathy.  Neurological:     Mental Status: She is alert.      UC Treatments / Results  Labs (all labs ordered are listed, but only abnormal results are displayed) Labs Reviewed - No data to display  EKG   Radiology No results found.  Procedures Procedures (including critical care time)  Medications Ordered in UC Medications - No data to display  Initial Impression / Assessment and Plan / UC Course  I have reviewed the triage vital signs and the nursing notes.  Pertinent labs & imaging results that were available during my care of the patient were reviewed by me and considered in my medical decision making (see chart for details).    Plan: The diagnosis will be treated with the following: 1.  Conjunctivitis bilaterally: A.  Polytrim ophthalmic solution, 1 drop each eye 4 times a day. 2.  Bilateral otitis media: A.  Amoxicillin 500 mg every 8 hours to treat infection. 3.  Upper respiratory tract infection: A.  Sudafed every 12 hours to treat congestion. B.  Advised take ibuprofen or Tylenol as needed for pain and discomfort. 4.  Advised follow-up PCP or return to urgent care as needed. Final Clinical Impressions(s) / UC Diagnoses   Final diagnoses:  Acute bacterial conjunctivitis of both eyes  Acute upper respiratory infection  Non-recurrent acute serous otitis media of both ears     Discharge Instructions      Advised to take the Sudafed every 12 hours to help decrease congestion. Advised to take the amoxicillin 500 mg  every 8 hours to treat the ear infection. Advised use of Polytrim eyedrops, 1 drop each eye 4 times a day to help treat the eye infection. Advised take Tylenol or Motrin as needed for body aches pains and discomfort.  Advised follow-up PCP or return to urgent care as needed.    ED Prescriptions     Medication Sig Dispense Auth. Provider   amoxicillin (AMOXIL) 500 MG capsule Take 1 capsule (500 mg total) by mouth 3 (three) times daily. 21 capsule Nyoka Lint, PA-C   trimethoprim-polymyxin b (POLYTRIM) ophthalmic solution Place 1 drop into both eyes every 6 (six) hours. 10 mL Nyoka Lint, PA-C   pseudoephedrine (SUDAFED) 30 MG tablet Take 1 tablet (30 mg total) by mouth 2 (two) times daily. 14 tablet Nyoka Lint, PA-C      PDMP not reviewed this encounter.   Nyoka Lint, PA-C 08/05/22 1158

## 2022-08-05 NOTE — ED Triage Notes (Addendum)
C/O starting with left eye irritation 08/01/22; now c/o bilat eye irritation, redness, pruritus, and drainage. C/O slight blurred vision in left eye. Does not wear contact lenses.

## 2022-10-16 ENCOUNTER — Encounter: Payer: Self-pay | Admitting: Advanced Practice Midwife

## 2022-10-17 ENCOUNTER — Ambulatory Visit (INDEPENDENT_AMBULATORY_CARE_PROVIDER_SITE_OTHER): Payer: Medicaid Other

## 2022-10-17 ENCOUNTER — Other Ambulatory Visit: Payer: Self-pay

## 2022-10-17 VITALS — BP 118/73 | HR 75 | Ht 64.0 in | Wt 164.5 lb

## 2022-10-17 DIAGNOSIS — R8290 Unspecified abnormal findings in urine: Secondary | ICD-10-CM

## 2022-10-17 LAB — POCT URINALYSIS DIP (DEVICE)
Bilirubin Urine: NEGATIVE
Glucose, UA: NEGATIVE mg/dL
Ketones, ur: NEGATIVE mg/dL
Nitrite: NEGATIVE
Protein, ur: NEGATIVE mg/dL
Specific Gravity, Urine: 1.02 (ref 1.005–1.030)
Urobilinogen, UA: 0.2 mg/dL (ref 0.0–1.0)
pH: 6.5 (ref 5.0–8.0)

## 2022-10-17 NOTE — Progress Notes (Addendum)
Pt here today for UTI sx's of the urge to go to restroom.  UA results trace blood and small leukocytes.  Pt advised that I will send for cx and we contact her with abnormal results.  Pt reports that she is having some vaginal spotting when she wipes.  Pt encouraged to schedule annual with pap upon check out with the front office.  Pt verbalized understanding with no further questions.   Leonette Nutting 10/17/22

## 2022-10-20 LAB — URINE CULTURE

## 2022-10-22 ENCOUNTER — Telehealth: Payer: Self-pay

## 2022-10-22 ENCOUNTER — Other Ambulatory Visit: Payer: Self-pay | Admitting: Family Medicine

## 2022-10-22 DIAGNOSIS — N3000 Acute cystitis without hematuria: Secondary | ICD-10-CM

## 2022-10-22 MED ORDER — NITROFURANTOIN MONOHYD MACRO 100 MG PO CAPS: 100.0000 mg | ORAL_CAPSULE | Freq: Two times a day (BID) | ORAL | 0 refills | Status: DC

## 2022-10-22 NOTE — Telephone Encounter (Signed)
-----   Message from Federico Flake, MD sent at 10/22/2022 10:54 AM EDT ----- UTI present on Urine culture.  It looks like this patient might have been eligible for treatment on 5/1 when she came for her RN visit.  I have sent in macrobid.

## 2022-10-22 NOTE — Telephone Encounter (Addendum)
-----   Message from Federico Flake, MD sent at 10/22/2022 10:54 AM EDT ----- UTI present on Urine culture.  It looks like this patient might have been eligible for treatment on 5/1 when she came for her RN visit.  I have sent in macrobid.   Left message that we have sent in an Rx for an UTI to please take as prescribed.  If she has questions to please call the office.    Leonette Nutting  10/22/22

## 2022-11-13 ENCOUNTER — Encounter: Payer: Self-pay | Admitting: Advanced Practice Midwife

## 2022-11-14 ENCOUNTER — Ambulatory Visit: Payer: Medicaid Other | Admitting: Obstetrics and Gynecology

## 2023-01-03 ENCOUNTER — Other Ambulatory Visit (HOSPITAL_COMMUNITY)
Admission: RE | Admit: 2023-01-03 | Discharge: 2023-01-03 | Disposition: A | Discharge status: Home / Self Care | Payer: Medicaid Other | Source: Ambulatory Visit | Attending: Obstetrics and Gynecology | Admitting: Obstetrics and Gynecology

## 2023-01-03 ENCOUNTER — Other Ambulatory Visit: Payer: Self-pay

## 2023-01-03 ENCOUNTER — Encounter: Payer: Self-pay | Admitting: Obstetrics and Gynecology

## 2023-01-03 ENCOUNTER — Ambulatory Visit (INDEPENDENT_AMBULATORY_CARE_PROVIDER_SITE_OTHER): Payer: Medicaid Other | Admitting: Obstetrics and Gynecology

## 2023-01-03 VITALS — BP 118/71 | HR 68 | Ht 67.0 in | Wt 159.7 lb

## 2023-01-03 DIAGNOSIS — Z01419 Encounter for gynecological examination (general) (routine) without abnormal findings: Secondary | ICD-10-CM | POA: Diagnosis not present

## 2023-01-03 DIAGNOSIS — Z23 Encounter for immunization: Secondary | ICD-10-CM | POA: Diagnosis not present

## 2023-01-03 DIAGNOSIS — Z124 Encounter for screening for malignant neoplasm of cervix: Secondary | ICD-10-CM | POA: Insufficient documentation

## 2023-01-03 DIAGNOSIS — Z113 Encounter for screening for infections with a predominantly sexual mode of transmission: Secondary | ICD-10-CM | POA: Diagnosis not present

## 2023-01-03 DIAGNOSIS — Z7185 Encounter for immunization safety counseling: Secondary | ICD-10-CM | POA: Diagnosis not present

## 2023-01-03 NOTE — Progress Notes (Signed)
ANNUAL EXAM Patient name: Rebekah Lloyd MRN [id number]  Date of birth: September 04, 1995 Chief Complaint:   Gynecologic Exam  History of Present Illness:   Rebekah Lloyd is a 27 y.o. [phone]  female being seen today for a routine annual exam.  Current complaints: no complains. Doing well with IUD, no vaginal, urinary, pelvic complaints. Has questions about HPV vaccine.   No LMP recorded (lmp unknown). (Menstrual status: IUD).   The pregnancy intention screening data noted above was reviewed. Potential methods of contraception were discussed. The patient elected to proceed with has an IUD   Last pap 02/10/19. Results were: NILM w/ HRHPV not done. H/O abnormal pap: no Last mammogram: n/a. Results were: N/A. Family h/o breast cancer: no Last colonoscopy: n/a. Results were: N/A. Family h/o colorectal cancer: yes father had colon cancer, age 30s, passed away colon cancer and covid      01/03/2023    8:46 AM 04/20/2021    3:45 PM 04/03/2021    3:41 PM 03/16/2021    4:14 PM 01/19/2021    1:29 PM  Depression screen PHQ 2/9  Decreased Interest 0 0 0 0 0  Down, Depressed, Hopeless 0 0 0 0 0  PHQ - 2 Score 0 0 0 0 0  Altered sleeping 0 0 0 0 0  Tired, decreased energy 0 0 0 0 0  Change in appetite 0 0 0 0 0  Feeling bad or failure about yourself  0 0 0 0 0  Trouble concentrating 0 0 0 0 0  Moving slowly or fidgety/restless 0 0 0 0 0  Suicidal thoughts 0 0 0 0 0  PHQ-9 Score 0 0 0 0 0        01/03/2023    8:46 AM 04/20/2021    3:45 PM 04/03/2021    3:41 PM 03/16/2021    4:14 PM  GAD 7 : Generalized Anxiety Score  Nervous, Anxious, on Edge 0 0 0 0  Control/stop worrying 0 0 0 0  Worry too much - different things 0 0 0 0  Trouble relaxing 0 0 0 0  Restless 0 0 0 0  Easily annoyed or irritable 0 0 0 0  Afraid - awful might happen 0 0 0 0  Total GAD 7 Score 0 0 0 0     Review of Systems:   Pertinent items are noted in HPI Denies any headaches, blurred vision, fatigue, shortness of  breath, chest pain, abdominal pain, abnormal vaginal discharge/itching/odor/irritation, problems with periods, bowel movements, urination, or intercourse unless otherwise stated above. Pertinent History Reviewed:  Reviewed past medical,surgical, social and family history.  Reviewed problem list, medications and allergies. Physical Assessment:   Vitals:   01/03/23 0839  BP: 118/71  Pulse: 68  Weight: 159 lb 11.2 oz (72.4 kg)  Height: 5\' 7"  (1.702 m)  Body mass index is 25.01 kg/m.        Physical Examination:   General appearance - well appearing, and in no distress  Mental status - alert, oriented to person, place, and time  Psych:  She has a normal mood and affect  Skin - warm and dry, normal color, no suspicious lesions noted  Chest - effort normal, all lung fields clear to auscultation bilaterally  Heart - normal rate and regular rhythm  Neck:  midline trachea, no thyromegaly or nodules  Breasts - breasts appear normal, no suspicious masses, no skin or nipple changes or  axillary nodes  Abdomen - soft, nontender,  nondistended, no masses or organomegaly  Pelvic - VULVA: normal appearing vulva with no masses, tenderness or lesions  VAGINA: normal appearing vagina with normal color and discharge, no lesions  CERVIX: normal appearing cervix without discharge or lesions, no CMT  Thin prep pap is done , IUD strings visible    Extremities:  No swelling or varicosities noted  Chaperone present for exam  No results found for this or any previous visit (from the past 24 hour(s)).  Assessment & Plan:  1. Well woman exam with routine gynecological exam  - Cytology - PAP( [facility]) - HPV 9-valent vaccine,Recombinat  2. Cervical cancer screening  - Cytology - PAP( [facility] [place])  3. Screen for STD (sexually transmitted disease)  - Cytology - PAP( [place])  4. HPV vaccine counseling Counseled through vaccine, 3 dose series, follow up  First dose today - HPV 9-valent  vaccine,Recombinat   Labs/procedures today:   Mammogram: @ 25yo, or sooner if problems Colonoscopy: @ 25yo, or sooner if problems  Orders Placed This Encounter  Procedures   HPV 9-valent vaccine,Recombinat    Meds: No orders of the defined types were placed in this encounter.   Follow-up: Return in about 1 year (around 01/03/2024), or 4 weeks nurse visit 2nd HPV vaccine, for Thayer Jew, FNP

## 2023-01-07 LAB — CYTOLOGY - PAP
Chlamydia: NEGATIVE
Comment: NEGATIVE
Comment: NEGATIVE
Comment: NORMAL
Neisseria Gonorrhea: NEGATIVE
Trichomonas: NEGATIVE

## 2023-01-08 ENCOUNTER — Telehealth: Payer: Self-pay

## 2023-01-08 NOTE — Telephone Encounter (Signed)
Called patient at number listed in chart--verified patient with full name and DOB. Informed patient of results/result notes from provider (listed below) and also read what provider wrote on her MyChart message to the patient regarding these results.  Patient willing to take next step of colposcopy procedure.  Informed patient that the front office will reach out to schedule patient for colposcopy. Patient verbalized understanding and had no other questions/concerns.   Maureen Ralphs RN on 01/08/23 at [phone]

## 2023-01-08 NOTE — Telephone Encounter (Signed)
-----   Message from [facility] sent at 01/08/2023  8:21 AM EDT ----- LSIL pap, needs colpo. Can we call and get her scheduled for colpo with another provider?

## 2023-02-01 ENCOUNTER — Ambulatory Visit (INDEPENDENT_AMBULATORY_CARE_PROVIDER_SITE_OTHER): Payer: Medicaid Other | Admitting: *Deleted

## 2023-02-01 ENCOUNTER — Other Ambulatory Visit: Payer: Self-pay

## 2023-02-01 VITALS — BP 112/63 | HR 73 | Ht 67.0 in | Wt 158.8 lb

## 2023-02-01 DIAGNOSIS — Z23 Encounter for immunization: Secondary | ICD-10-CM | POA: Diagnosis not present

## 2023-02-01 NOTE — Progress Notes (Signed)
Rebekah Lloyd here for 2nd hpv vaccine.  Injection administered without complication. Patient will return in 5 months for next injection. Nancy Fetter 02/01/2023  9:36 AM

## 2023-02-13 ENCOUNTER — Encounter: Payer: Self-pay | Admitting: Obstetrics & Gynecology

## 2023-02-13 ENCOUNTER — Other Ambulatory Visit: Payer: Self-pay

## 2023-02-13 ENCOUNTER — Other Ambulatory Visit (HOSPITAL_COMMUNITY)
Admission: RE | Admit: 2023-02-13 | Discharge: 2023-02-13 | Disposition: A | Discharge status: Home / Self Care | Payer: Medicaid Other | Source: Ambulatory Visit | Attending: Obstetrics & Gynecology | Admitting: Obstetrics & Gynecology

## 2023-02-13 ENCOUNTER — Ambulatory Visit (INDEPENDENT_AMBULATORY_CARE_PROVIDER_SITE_OTHER): Payer: Medicaid Other | Admitting: Obstetrics & Gynecology

## 2023-02-13 VITALS — BP 111/75 | HR 82 | Wt 158.8 lb

## 2023-02-13 DIAGNOSIS — Z975 Presence of (intrauterine) contraceptive device: Secondary | ICD-10-CM

## 2023-02-13 DIAGNOSIS — R87612 Low grade squamous intraepithelial lesion on cytologic smear of cervix (LGSIL): Secondary | ICD-10-CM | POA: Diagnosis not present

## 2023-02-13 DIAGNOSIS — N870 Mild cervical dysplasia: Secondary | ICD-10-CM | POA: Diagnosis not present

## 2023-02-13 DIAGNOSIS — N72 Inflammatory disease of cervix uteri: Secondary | ICD-10-CM | POA: Diagnosis not present

## 2023-02-13 NOTE — Progress Notes (Signed)
Patient ID: Rebekah Lloyd, female   DOB: [birth year], 27 y.o.   MRN: [id number]  Chief Complaint  Patient presents with   Procedure    HPI Rebekah Lloyd is a 27 y.o. female.  [id number] No LMP recorded. (Menstrual status: IUD). IUD in place HPI  Indications: Pap smear on July 2024 showed: low-grade squamous intraepithelial neoplasia (LGSIL - encompassing HPV,mild dysplasia,CIN I). Previous colposcopy: . Prior cervical treatment: no treatment.  Past Medical History:  Diagnosis Date   Dyspnea     Past Surgical History:  Procedure Laterality Date   NO PAST SURGERIES      Family History  Problem Relation Age of Onset   Cancer Father    Colon cancer Father     Social History Social History   Tobacco Use   Smoking status: Never   Smokeless tobacco: Never  Vaping Use   Vaping status: Never Used  Substance Use Topics   Alcohol use: No   Drug use: Not Currently    Types: Marijuana    No Known Allergies  Current Outpatient Medications  Medication Sig Dispense Refill   valACYclovir (VALTREX) 1000 MG tablet Take 1 tablet (1,000 mg total) by mouth daily. 60 tablet 1   nitrofurantoin, macrocrystal-monohydrate, (MACROBID) 100 MG capsule Take 1 capsule (100 mg total) by mouth 2 (two) times daily. (Patient not taking: Reported on 02/13/2023) 14 capsule 0   No current facility-administered medications for this visit.    Review of Systems Review of Systems  All other systems reviewed and are negative.   Blood pressure 111/75, pulse 82, weight 158 lb 12.8 oz (72 kg), not currently breastfeeding.  Physical Exam Physical Exam Vitals and nursing note reviewed. Exam conducted with a chaperone present.  Constitutional:      Appearance: Normal appearance.  Cardiovascular:     Rate and Rhythm: Normal rate.  Pulmonary:     Effort: Pulmonary effort is normal.  Genitourinary:    General: Normal vulva.     Exam position: Lithotomy position.     Vagina: Normal.     Cervix:  Normal.     Comments: Strings 2 cm Neurological:     Mental Status: She is alert.  Psychiatric:        Mood and Affect: Mood normal.        Behavior: Behavior normal.   Patient given informed consent, signed copy in the chart, time out was performed.  Placed in lithotomy position. Cervix viewed with speculum and colposcope after application of Lugol's soln.   Colposcopy adequate?  yes Acetowhite lesions?yes non staining Punctation?no Mosaicism?  no Abnormal vasculature?  no Biopsies?yes at 4 ECC?yes  COMMENTS: Patient was given post procedure instructions.     Data Reviewed pap  Assessment    Procedure Details  The risks and benefits of the procedure and Written informed consent obtained.  Speculum placed in vagina and excellent visualization of cervix achieved, cervix swabbed x 3 with acetic acid solution.  Specimens: ECC and bx at 0400  Complications: none.     Plan    Specimens labelled and sent to Pathology.       Scheryl Darter 02/13/2023, 9:17 AM

## 2023-02-21 LAB — SURGICAL PATHOLOGY

## 2023-02-24 NOTE — Progress Notes (Signed)
12 month repeat pap for LSIL

## 2023-02-25 ENCOUNTER — Telehealth: Payer: Self-pay

## 2023-02-25 NOTE — Telephone Encounter (Signed)
Called Pt to go over negative test results and for her to repeat Pap in 12 months, Pt verbalized understanding.

## 2023-02-25 NOTE — Telephone Encounter (Signed)
-----   Message from Scheryl Darter sent at 02/24/2023 10:03 AM EDT ----- 12 month repeat pap for LSIL

## 2023-07-08 ENCOUNTER — Other Ambulatory Visit: Payer: Self-pay

## 2023-07-08 ENCOUNTER — Ambulatory Visit: Payer: Medicaid Other | Admitting: *Deleted

## 2023-07-08 VITALS — BP 120/60 | HR 59 | Ht 67.0 in | Wt 153.5 lb

## 2023-07-08 DIAGNOSIS — Z23 Encounter for immunization: Secondary | ICD-10-CM | POA: Diagnosis not present

## 2023-07-08 NOTE — Progress Notes (Signed)
Here for 3rd Gardisil. First given 01/03/23. Second given 02/01/23. Third given today without complaint. Rebekah Lloyd

## 2023-07-23 ENCOUNTER — Other Ambulatory Visit: Payer: Self-pay

## 2023-07-23 ENCOUNTER — Ambulatory Visit (INDEPENDENT_AMBULATORY_CARE_PROVIDER_SITE_OTHER): Payer: Medicaid Other

## 2023-07-23 VITALS — BP 115/61 | HR 75 | Ht 67.0 in | Wt 154.0 lb

## 2023-07-23 DIAGNOSIS — R350 Frequency of micturition: Secondary | ICD-10-CM | POA: Diagnosis not present

## 2023-07-23 LAB — POCT URINALYSIS DIP (DEVICE)
Bilirubin Urine: NEGATIVE
Glucose, UA: NEGATIVE mg/dL
Hgb urine dipstick: NEGATIVE
Ketones, ur: NEGATIVE mg/dL
Nitrite: NEGATIVE
Protein, ur: NEGATIVE mg/dL
Specific Gravity, Urine: >=1.03 (ref 1.005–1.030)
Urobilinogen, UA: 0.2 mg/dL (ref 0.0–1.0)
pH: 6 (ref 5.0–8.0)

## 2023-07-23 MED ORDER — PHENAZOPYRIDINE HCL 200 MG PO TABS
200.0000 mg | ORAL_TABLET | Freq: Three times a day (TID) | ORAL | 0 refills | Status: DC | PRN
Start: 2023-07-23 — End: 2023-11-07

## 2023-07-23 NOTE — Progress Notes (Signed)
 Rebekah Lloyd is here with concern of urinary frequency. These symptoms have been present for 2 weeks and patient is concern of possible UTI. Patient denies any burning with urination.   Urine sample given. Urinalysis done and showed trace protein and small leukocytes. Epifanio Credit, CNM who stated OK to order urine cultures and prescribe pyridium  200 mg PO TID PRN for 2 days until urine cultures comes back. Explained patient will be contacted with any abnormal results and pyridium  may turn urine red or orange. Patient voiced understanding. Patient's next annual exam is 01/2024.  Rosaline Pendleton, RN 07/23/2023  10:25 AM

## 2023-07-25 LAB — URINE CULTURE

## 2023-10-28 ENCOUNTER — Encounter: Payer: Self-pay | Admitting: Family Medicine

## 2023-11-07 ENCOUNTER — Ambulatory Visit

## 2023-11-07 ENCOUNTER — Other Ambulatory Visit (HOSPITAL_COMMUNITY)
Admission: RE | Admit: 2023-11-07 | Discharge: 2023-11-07 | Disposition: A | Discharge status: Home / Self Care | Source: Ambulatory Visit | Attending: Family Medicine | Admitting: Family Medicine

## 2023-11-07 ENCOUNTER — Other Ambulatory Visit: Payer: Self-pay

## 2023-11-07 VITALS — BP 129/89 | HR 80

## 2023-11-07 DIAGNOSIS — Z113 Encounter for screening for infections with a predominantly sexual mode of transmission: Secondary | ICD-10-CM | POA: Diagnosis present

## 2023-11-07 NOTE — Progress Notes (Signed)
 Pt here for STI screening.  Pt denies discharge or odor.  Pt explained how to obtain self swab and that we will call with abnormal results.  Pt verbalized understanding.   Tryston Gilliam,RN  11/07/23

## 2023-11-08 ENCOUNTER — Ambulatory Visit: Payer: Self-pay | Admitting: Family Medicine

## 2023-11-08 LAB — CERVICOVAGINAL ANCILLARY ONLY
Bacterial Vaginitis (gardnerella): POSITIVE — AB
Candida Glabrata: NEGATIVE
Candida Vaginitis: POSITIVE — AB
Chlamydia: NEGATIVE
Comment: NEGATIVE
Comment: NEGATIVE
Comment: NEGATIVE
Comment: NEGATIVE
Comment: NEGATIVE
Comment: NORMAL
Neisseria Gonorrhea: NEGATIVE
Trichomonas: NEGATIVE

## 2023-11-08 MED ORDER — FLUCONAZOLE 150 MG PO TABS: 150.0000 mg | ORAL_TABLET | Freq: Every day | ORAL | 2 refills | Status: AC

## 2023-11-08 MED ORDER — METRONIDAZOLE 500 MG PO TABS: 500.0000 mg | ORAL_TABLET | Freq: Two times a day (BID) | ORAL | 0 refills | Status: AC

## 2024-01-03 ENCOUNTER — Encounter: Payer: Self-pay | Admitting: Advanced Practice Midwife

## 2024-01-28 ENCOUNTER — Encounter: Payer: Self-pay | Admitting: Family Medicine

## 2024-02-05 ENCOUNTER — Other Ambulatory Visit: Payer: Self-pay

## 2024-02-05 ENCOUNTER — Other Ambulatory Visit (HOSPITAL_COMMUNITY)
Admission: RE | Admit: 2024-02-05 | Discharge: 2024-02-05 | Disposition: A | Discharge status: Home / Self Care | Source: Ambulatory Visit | Attending: Family Medicine | Admitting: Family Medicine

## 2024-02-05 ENCOUNTER — Ambulatory Visit (INDEPENDENT_AMBULATORY_CARE_PROVIDER_SITE_OTHER)

## 2024-02-05 VITALS — BP 98/52 | HR 76 | Ht 67.0 in | Wt 152.0 lb

## 2024-02-05 DIAGNOSIS — Z113 Encounter for screening for infections with a predominantly sexual mode of transmission: Secondary | ICD-10-CM | POA: Diagnosis not present

## 2024-02-05 DIAGNOSIS — R350 Frequency of micturition: Secondary | ICD-10-CM | POA: Diagnosis not present

## 2024-02-05 LAB — POCT URINALYSIS DIP (DEVICE)
Bilirubin Urine: NEGATIVE
Glucose, UA: NEGATIVE mg/dL
Hgb urine dipstick: NEGATIVE
Ketones, ur: NEGATIVE mg/dL
Leukocytes,Ua: NEGATIVE
Nitrite: NEGATIVE
Protein, ur: 30 mg/dL — AB
Specific Gravity, Urine: 1.02 (ref 1.005–1.030)
Urobilinogen, UA: 0.2 mg/dL (ref 0.0–1.0)
pH: 7 (ref 5.0–8.0)

## 2024-02-05 LAB — CERVICOVAGINAL ANCILLARY ONLY
Bacterial Vaginitis (gardnerella): POSITIVE — AB
Candida Glabrata: NEGATIVE
Candida Vaginitis: NEGATIVE
Chlamydia: NEGATIVE
Comment: NEGATIVE
Comment: NEGATIVE
Comment: NEGATIVE
Comment: NEGATIVE
Comment: NEGATIVE
Comment: NORMAL
Neisseria Gonorrhea: NEGATIVE
Trichomonas: NEGATIVE

## 2024-02-05 MED ORDER — PHENAZOPYRIDINE HCL 200 MG PO TABS
200.0000 mg | ORAL_TABLET | Freq: Three times a day (TID) | ORAL | 0 refills | Status: AC
Start: 2024-02-05 — End: ?

## 2024-02-05 NOTE — Progress Notes (Signed)
 Glorianne CINDERELLA Pinal is here with concern of urinary frequency for approximately one week. Patient also endorses not fully emptying bladder and some dysuria.  Urinalysis done which resulted 30 mg protein. Urine cultures sent and advised patient she will be contacted if any antibiotics is needed. Per protocol, Pyridium  sent to pharmacy and patient informed to take with food and urine may turn red/orange.  Patient also request to do a self swab.  Self swab instructions given and specimen obtained. Explained patient will be contacted with any abnormal results. Patient is due for annual exam; offered for patient to schedule during checkout.   Rosaline Pendleton, RN 02/05/2024  10:35 AM

## 2024-02-06 ENCOUNTER — Ambulatory Visit: Payer: Self-pay | Admitting: Advanced Practice Midwife

## 2024-02-06 DIAGNOSIS — N760 Acute vaginitis: Secondary | ICD-10-CM

## 2024-02-06 MED ORDER — METRONIDAZOLE 500 MG PO TABS: 500.0000 mg | ORAL_TABLET | Freq: Two times a day (BID) | ORAL | 0 refills | Status: DC

## 2024-03-19 ENCOUNTER — Ambulatory Visit: Admitting: Physician Assistant

## 2024-04-15 ENCOUNTER — Ambulatory Visit (INDEPENDENT_AMBULATORY_CARE_PROVIDER_SITE_OTHER)

## 2024-04-15 ENCOUNTER — Other Ambulatory Visit (HOSPITAL_COMMUNITY)
Admission: RE | Admit: 2024-04-15 | Discharge: 2024-04-15 | Disposition: A | Discharge status: Home / Self Care | Source: Ambulatory Visit | Attending: Family Medicine | Admitting: Family Medicine

## 2024-04-15 ENCOUNTER — Other Ambulatory Visit: Payer: Self-pay

## 2024-04-15 VITALS — BP 128/83 | HR 70 | Ht 67.0 in | Wt 149.1 lb

## 2024-04-15 DIAGNOSIS — N898 Other specified noninflammatory disorders of vagina: Secondary | ICD-10-CM | POA: Insufficient documentation

## 2024-04-15 NOTE — Progress Notes (Signed)
 Rebekah Lloyd is here today for a self-swab. She states her current partner may have other sexual partners. She states she has been experiencing vaginal odor and discharge.   I explained to Rebekah Lloyd how to obtain a self-swab and she verbalized understanding. I told [name] that someone will be in contact with any abnormal results. I verified her phone number and patient states she has Mychart access. Patient states she has no further questions or concerns.   Devon, RN 04/15/24

## 2024-04-17 LAB — CERVICOVAGINAL ANCILLARY ONLY
Bacterial Vaginitis (gardnerella): POSITIVE — AB
Candida Glabrata: NEGATIVE
Candida Vaginitis: NEGATIVE
Chlamydia: NEGATIVE
Comment: NEGATIVE
Comment: NEGATIVE
Comment: NEGATIVE
Comment: NEGATIVE
Comment: NEGATIVE
Comment: NORMAL
Neisseria Gonorrhea: NEGATIVE
Trichomonas: NEGATIVE

## 2024-04-20 ENCOUNTER — Ambulatory Visit: Payer: Self-pay | Admitting: Obstetrics and Gynecology

## 2024-04-20 DIAGNOSIS — B9689 Other specified bacterial agents as the cause of diseases classified elsewhere: Secondary | ICD-10-CM

## 2024-04-20 MED ORDER — METRONIDAZOLE 500 MG PO TABS: 500.0000 mg | ORAL_TABLET | Freq: Two times a day (BID) | ORAL | 0 refills | Status: AC

## 2024-06-09 ENCOUNTER — Ambulatory Visit: Admitting: Family Medicine

## 2024-07-15 ENCOUNTER — Encounter: Payer: Self-pay | Admitting: Family Medicine

## 2024-07-15 ENCOUNTER — Ambulatory Visit: Admitting: Family Medicine

## 2024-07-15 ENCOUNTER — Other Ambulatory Visit: Payer: Self-pay

## 2024-07-15 ENCOUNTER — Other Ambulatory Visit (HOSPITAL_COMMUNITY)
Admission: RE | Admit: 2024-07-15 | Discharge: 2024-07-15 | Disposition: A | Discharge status: Home / Self Care | Source: Ambulatory Visit | Attending: Family Medicine | Admitting: Family Medicine

## 2024-07-15 VITALS — BP 116/76 | HR 88 | Wt 154.1 lb

## 2024-07-15 DIAGNOSIS — Z01419 Encounter for gynecological examination (general) (routine) without abnormal findings: Secondary | ICD-10-CM

## 2024-07-15 DIAGNOSIS — T8332XA Displacement of intrauterine contraceptive device, initial encounter: Secondary | ICD-10-CM | POA: Diagnosis not present

## 2024-07-15 LAB — POCT PREGNANCY, URINE: Preg Test, Ur: NEGATIVE

## 2024-07-16 LAB — RPR+HBSAG+HCVAB+...
HIV Screen 4th Generation wRfx: NONREACTIVE
Hep C Virus Ab: NONREACTIVE
Hepatitis B Surface Ag: NEGATIVE
RPR Ser Ql: NONREACTIVE

## 2024-07-16 LAB — GC/CHLAMYDIA PROBE AMP (~~LOC~~) NOT AT ARMC
Chlamydia: NEGATIVE
Comment: NEGATIVE
Comment: NORMAL
Neisseria Gonorrhea: NEGATIVE

## 2024-07-17 DIAGNOSIS — T8332XA Displacement of intrauterine contraceptive device, initial encounter: Secondary | ICD-10-CM | POA: Insufficient documentation

## 2024-07-17 LAB — CYTOLOGY - PAP
Adequacy: ABSENT
Comment: NEGATIVE
Diagnosis: NEGATIVE
High risk HPV: NEGATIVE

## 2024-07-17 NOTE — Progress Notes (Signed)
 "  ANNUAL EXAM Patient name: Rebekah Lloyd MRN [id number]  Date of birth: 03-12-1996 Chief Complaint:   Gynecologic Exam  History of Present Illness:   Rebekah Lloyd is a 29 y.o. [id number] African-American female being seen today for a routine annual exam.  Current complaints: No current concerns  No LMP recorded. (Menstrual status: IUD).   The pregnancy intention screening data noted above was reviewed. Potential methods of contraception were discussed. The patient elected to proceed with IUD or IUS.   Last pap 01/03/2023. Results were: LSIL-H w/ HRHPV not done. H/O abnormal pap: yes Last mammogram: Never done. Results were: N/A. Family h/o breast cancer: no Last colonoscopy: Never done. Results were: N/A. Family h/o colorectal cancer: yes her father had colon cancer in his mid to late 98s     07/15/2024    1:51 PM 02/13/2023    8:59 AM 02/01/2023    9:38 AM 01/03/2023    8:46 AM 04/20/2021    3:45 PM  Depression screen PHQ 2/9  Decreased Interest 0 0 0 0 0  Down, Depressed, Hopeless 0 0 0 0 0  PHQ - 2 Score 0 0 0 0 0  Altered sleeping 0 0 0 0 0  Tired, decreased energy 0 0 0 0 0  Change in appetite 0 0 0 0 0  Feeling bad or failure about yourself  0 0 0 0 0  Trouble concentrating 0 0 0 0 0  Moving slowly or fidgety/restless 0 0 0 0 0  Suicidal thoughts 0 0 0 0 0  PHQ-9 Score 0 0  0  0  0      Data saved with a previous flowsheet row definition        07/15/2024    1:51 PM 02/13/2023    9:00 AM 02/01/2023    9:38 AM 01/03/2023    8:46 AM  GAD 7 : Generalized Anxiety Score  Nervous, Anxious, on Edge 0 0  0  0   Control/stop worrying 0 0  0  0   Worry too much - different things 0 0  0  0   Trouble relaxing 0 0  0  0   Restless 0 0  0  0   Easily annoyed or irritable 0 0  0  0   Afraid - awful might happen 0 0  0  0   Total GAD 7 Score 0 0 0 0     Data saved with a previous flowsheet row definition     Review of Systems:   Pertinent items are noted in HPI Denies  any headaches, blurred vision, fatigue, shortness of breath, chest pain, abdominal pain, abnormal vaginal discharge/itching/odor/irritation, problems with periods, bowel movements, urination, or intercourse unless otherwise stated above. Pertinent History Reviewed:  Reviewed past medical,surgical, social and family history.  Reviewed problem list, medications and allergies. Physical Assessment:   Vitals:   07/15/24 1349  BP: 116/76  Pulse: 88  Weight: 154 lb 1.6 oz (69.9 kg)  Body mass index is 24.14 kg/m.        Physical Examination:   General appearance - well appearing, and in no distress  Mental status - alert, oriented to person, place, and time  Psych:  She has a normal mood and affect  Skin - warm and dry, normal color, no suspicious lesions noted  Chest - effort normal, all lung fields clear to auscultation bilaterally  Heart - normal rate and regular rhythm  Neck:  midline  trachea, no thyromegaly or nodules  Abdomen - soft, nontender, nondistended, no masses or organomegaly  Pelvic - VULVA: normal appearing vulva with no masses, tenderness or lesions  VAGINA: normal appearing vagina with normal color and discharge, no lesions  CERVIX: normal appearing cervix without discharge or lesions, no CMT no visible IUD strings.  Attempted to tease strings out of cervix with Cytobrush but unsuccessful  Thin prep pap is done with HR HPV cotesting  UTERUS: uterus is felt to be normal size, shape, consistency and nontender   ADNEXA: No adnexal masses or tenderness noted.  Extremities:  No swelling or varicosities noted  Chaperone present for exam  No results found for this or any previous visit (from the past 24 hours).  Assessment & Plan:  1) Well-Woman Exam  2) lost IUD strings-patient reports she should have an IUD.  Does not have regular menses while having IUD in place.  Strings not visible and attempted to tease strings out but not successful.  Will obtain ultrasound to look for  IUD.  Labs/procedures today: Pap smear, ultrasound ordered, STI screening  Mammogram: @ 27yo, or sooner if problems Colonoscopy: @ 29yo, or sooner if problems  Orders Placed This Encounter  Procedures   US  PELVIC COMPLETE WITH TRANSVAGINAL   RPR+HBsAg+HCVAb+...   Pregnancy, urine POC    Meds: No orders of the defined types were placed in this encounter.   Follow-up: No follow-ups on file.  Shaniyah Wix V Dosha Broshears, MD 07/17/2024 11:58 AM  "

## 2024-07-22 ENCOUNTER — Ambulatory Visit (HOSPITAL_BASED_OUTPATIENT_CLINIC_OR_DEPARTMENT_OTHER)

## 2024-07-22 ENCOUNTER — Ambulatory Visit: Payer: Self-pay | Admitting: Family Medicine

## 2024-07-23 ENCOUNTER — Ambulatory Visit (HOSPITAL_BASED_OUTPATIENT_CLINIC_OR_DEPARTMENT_OTHER): Admission: RE | Admit: 2024-07-23 | Discharge: 2024-07-23 | Attending: Family Medicine

## 2024-07-23 DIAGNOSIS — T8332XA Displacement of intrauterine contraceptive device, initial encounter: Secondary | ICD-10-CM

## 2024-07-23 DIAGNOSIS — Z01419 Encounter for gynecological examination (general) (routine) without abnormal findings: Secondary | ICD-10-CM
